# Patient Record
Sex: Female | Born: 1995 | Race: White | Hispanic: No | Marital: Single | Smoking: Never smoker
Health system: Southern US, Community
[De-identification: ages and names within clinical notes are randomized; demographics above are authoritative.]

## PROBLEM LIST (undated history)

## (undated) DIAGNOSIS — G8929 Other chronic pain: Secondary | ICD-10-CM

## (undated) DIAGNOSIS — M546 Pain in thoracic spine: Secondary | ICD-10-CM

## (undated) DIAGNOSIS — F1290 Cannabis use, unspecified, uncomplicated: Secondary | ICD-10-CM

## (undated) DIAGNOSIS — F329 Major depressive disorder, single episode, unspecified: Secondary | ICD-10-CM

## (undated) DIAGNOSIS — F32A Depression, unspecified: Secondary | ICD-10-CM

---
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
## 2014-02-13 HISTORY — PX: HAND SURGERY: SHX662

## 2014-04-28 ENCOUNTER — Emergency Department: Payer: Self-pay | Admitting: Emergency Medicine

## 2014-05-11 ENCOUNTER — Ambulatory Visit: Payer: Self-pay | Admitting: Unknown Physician Specialty

## 2014-06-14 NOTE — Op Note (Signed)
PATIENT NAME:  Rachel Richards, Rachel Richards MR#:  [id number] DATE OF BIRTH:  08-Oct-1995  DATE OF PROCEDURE:  05/11/2014  PREOPERATIVE DIAGNOSIS: Displaced right fifth distal metacarpal fracture.   POSTOPERATIVE DIAGNOSIS: Displaced right fifth distal metacarpal fracture.   OPERATION: Closed reduction and intramedullary pinning of the displaced right fifth metacarpal fracture.   SURGEON: Alda BertholdHarold B. Melvin Marmo Jr., MD   ANESTHESIA: General.   HISTORY: The patient sustained a fracture of her right fifth metacarpal about 2 weeks prior to surgery. She had been scheduled for surgery about a week and a half after her injury, but she had an upper respiratory illness and the surgery had to be postponed until 2 weeks out from her injury. The patient was brought in for closed reduction and intramedullary pinning.   DESCRIPTION OF PROCEDURE: The patient was taken to the operating room where satisfactory general anesthesia was achieved. A tourniquet was applied to her right upper arm and the right upper extremity was prepped and draped in the usual fashion for procedure about the hand.   I went ahead and made a small longitudinal incision along the dorsal ulnar base of her right fifth metacarpal. An awl was used to make a hole in the base and then a [employer] intramedullary pin was inserted into the proximal metacarpal. The fracture was manipulated and reduced. The pin was driven across the distal third metacarpal fracture into the distal fracture fragment. This was done with the aid of image intensification. The image intensification views did reveal that the fracture was reduced and the pin was satisfactorily positioned.   I went ahead and bent the proximal portion of the pin about 90 degrees and then used a proximal fixation sleeve to prevent the pin from migrating proximally. This sleeve was driven into the base of the right fifth metacarpal. The redundant portion of the pin and sleeve were cut off  and a small  rubber cap was placed over it. It was left protruding outside the skin.   The small incision was closed with 5-0 nylon sutures in vertical mattress fashion. Betadine was applied to the wound followed by a sterile dressing and a fiberglass short arm cast. I incorporated an aluminum splint for the right little finger.   The patient was then awakened and transferred to her stretcher bed. She was taken to the recovery room in satisfactory condition. Blood loss was negligible.    ____________________________ Alda BertholdHarold B. Taegen Lennox Jr., MD hbk:AT Richards: 05/11/2014 14:59:25 ET T: 05/12/2014 04:05:04 ET JOB#: [id number]  cc: Alda BertholdHarold B. Thurma Priego Jr., MD, <Dictator> Randon GoldsmithHAROLD B Antoinette Borgwardt, Montez HagemanJR MD ELECTRONICALLY SIGNED 05/25/2014 19:12

## 2014-10-14 DIAGNOSIS — M546 Pain in thoracic spine: Secondary | ICD-10-CM

## 2014-10-14 DIAGNOSIS — G8929 Other chronic pain: Secondary | ICD-10-CM | POA: Insufficient documentation

## 2014-10-14 DIAGNOSIS — F329 Major depressive disorder, single episode, unspecified: Secondary | ICD-10-CM | POA: Insufficient documentation

## 2014-10-14 DIAGNOSIS — F32A Depression, unspecified: Secondary | ICD-10-CM | POA: Insufficient documentation

## 2015-03-26 IMAGING — CR RIGHT HAND - COMPLETE 3+ VIEW
1 series · 3 of 3 positions shown · non-contrast
Comparison: None.

CLINICAL DATA: Punched wall today.

EXAM:
RIGHT HAND - COMPLETE 3+ VIEW

[Series 1: x hand pa right · 0.14mm/px · 3 of 3 slices shown]
[im 1/3]
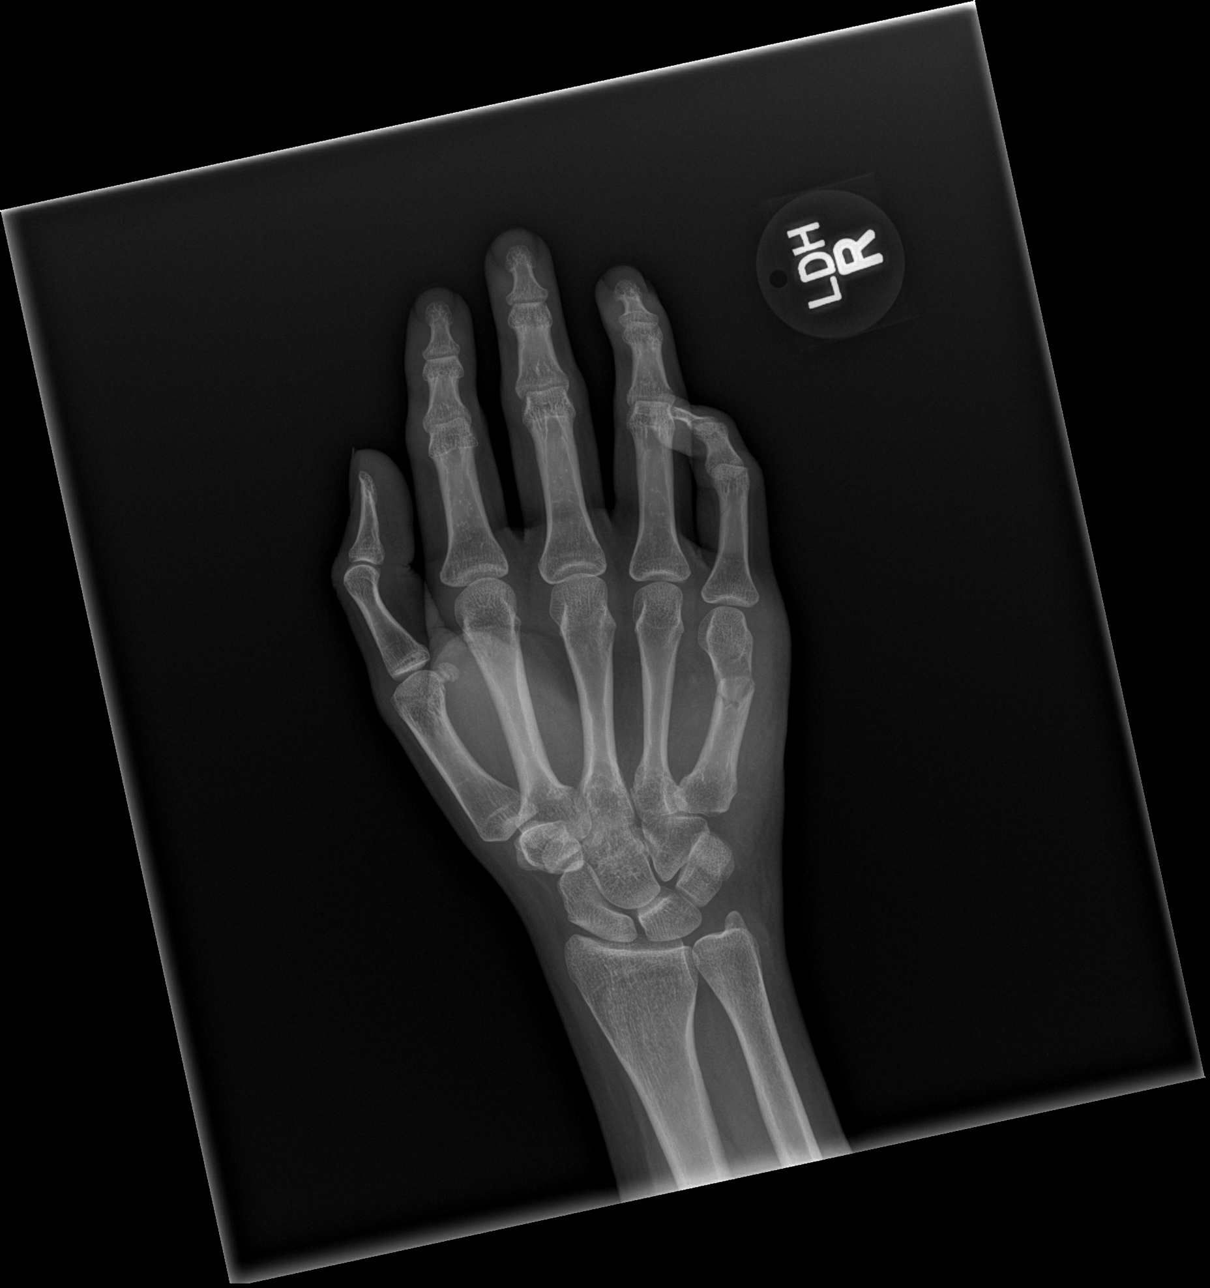
[im 2/3]
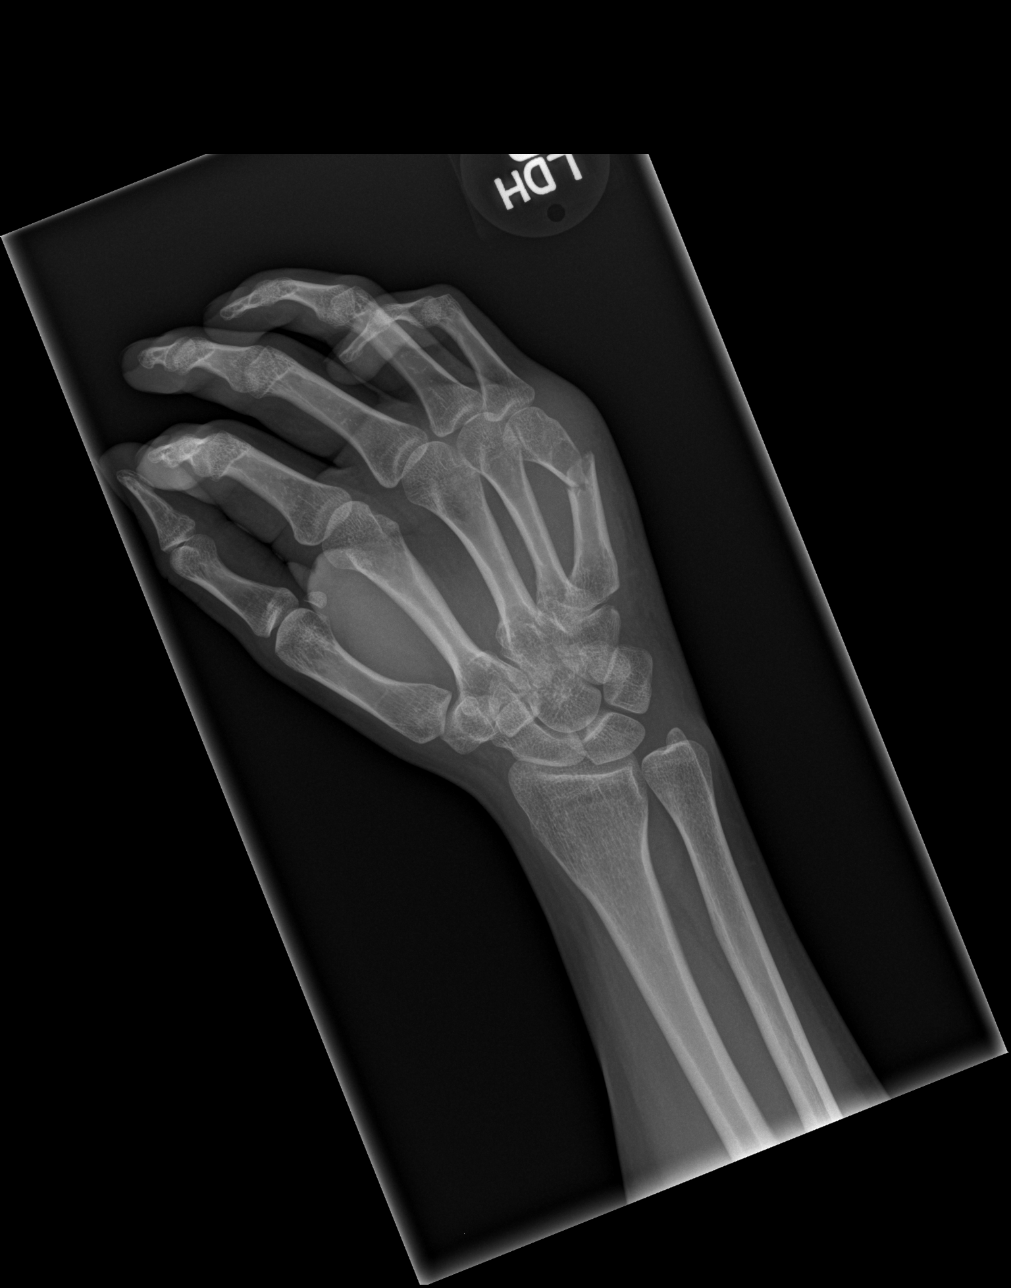
[im 3/3]
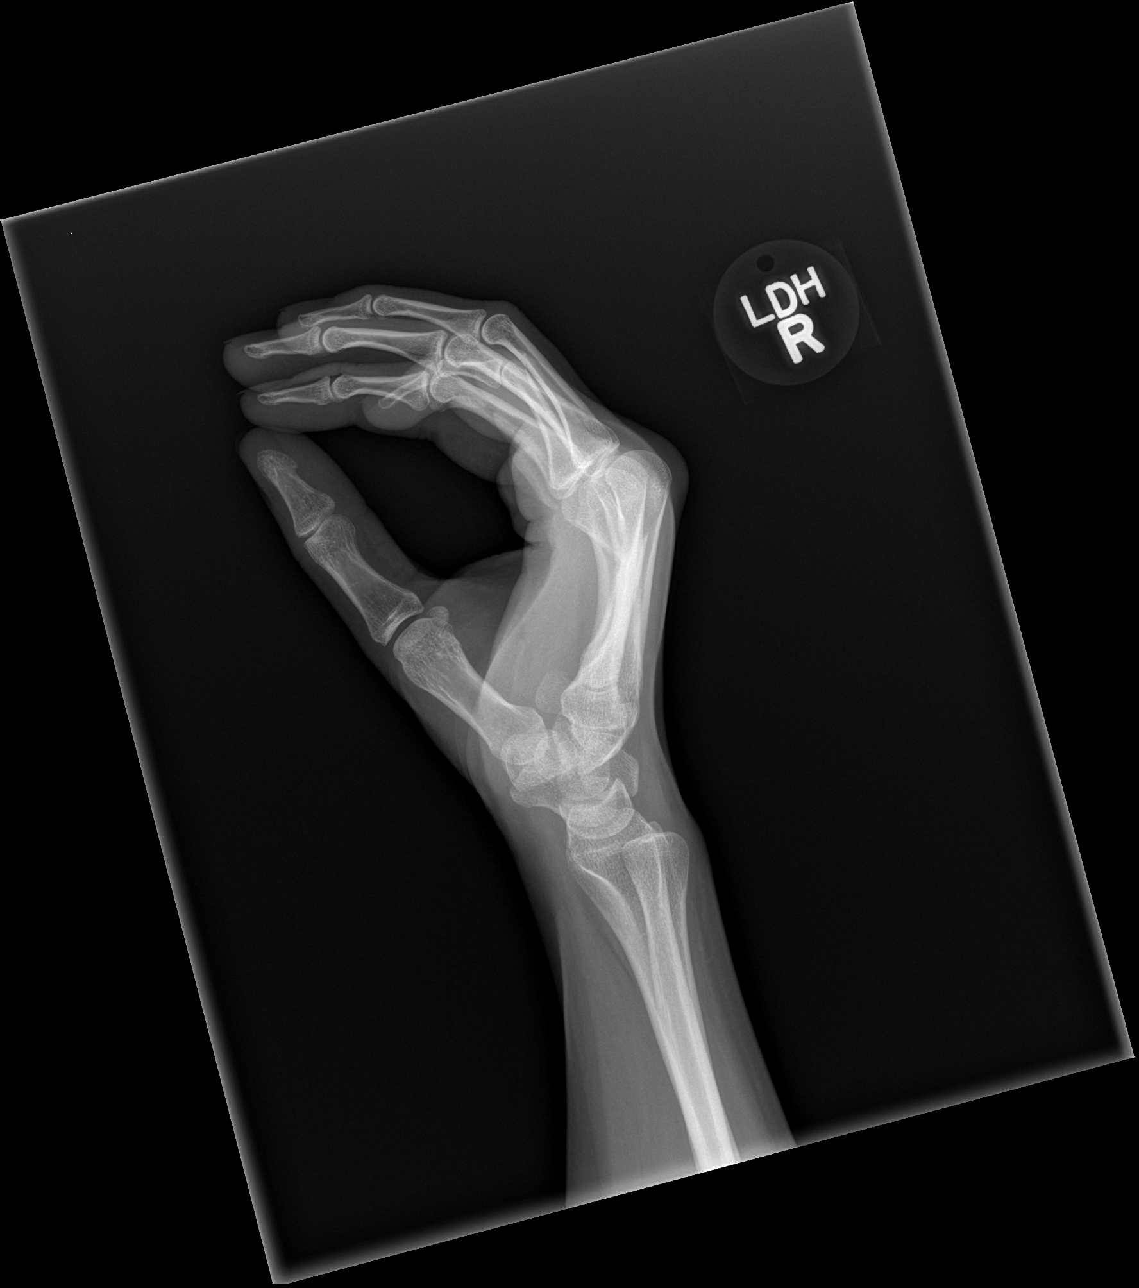

[3 of 3 positions shown; findings below may reference images not displayed]

FINDINGS: Angulated fracture mid shaft of the fifth metacarpal. No other
fracture or arthropathy.
IMPRESSION: Angulated fracture fifth metacarpal.

## 2017-01-11 LAB — OB RESULTS CONSOLE RPR: RPR: NONREACTIVE

## 2017-01-11 LAB — OB RESULTS CONSOLE GC/CHLAMYDIA
CHLAMYDIA, DNA PROBE: NEGATIVE
Gonorrhea: NEGATIVE

## 2017-01-11 LAB — OB RESULTS CONSOLE HEPATITIS B SURFACE ANTIGEN: Hepatitis B Surface Ag: NEGATIVE

## 2017-01-11 LAB — OB RESULTS CONSOLE ABO/RH: RH Type: POSITIVE

## 2017-01-11 LAB — OB RESULTS CONSOLE ANTIBODY SCREEN: ANTIBODY SCREEN: NEGATIVE

## 2017-01-11 LAB — SICKLE CELL SCREEN: Sickle Cell Screen: NEGATIVE

## 2017-01-11 LAB — OB RESULTS CONSOLE PLATELET COUNT: PLATELETS: 293

## 2017-01-11 LAB — OB RESULTS CONSOLE HGB/HCT, BLOOD
HCT: 42
Hemoglobin: 14.3

## 2017-01-12 ENCOUNTER — Other Ambulatory Visit: Payer: Self-pay | Admitting: Physician Assistant

## 2017-01-12 DIAGNOSIS — Z369 Encounter for antenatal screening, unspecified: Secondary | ICD-10-CM

## 2017-01-17 ENCOUNTER — Encounter: Payer: Self-pay | Admitting: Maternal Newborn

## 2017-01-17 ENCOUNTER — Ambulatory Visit (INDEPENDENT_AMBULATORY_CARE_PROVIDER_SITE_OTHER): Payer: Medicaid Other | Admitting: Maternal Newborn

## 2017-01-17 VITALS — BP 110/70 | Ht 62.0 in | Wt 130.0 lb

## 2017-01-17 DIAGNOSIS — Z3A08 8 weeks gestation of pregnancy: Secondary | ICD-10-CM

## 2017-01-17 DIAGNOSIS — Z3400 Encounter for supervision of normal first pregnancy, unspecified trimester: Secondary | ICD-10-CM | POA: Insufficient documentation

## 2017-01-17 DIAGNOSIS — Z3401 Encounter for supervision of normal first pregnancy, first trimester: Secondary | ICD-10-CM

## 2017-01-17 NOTE — Patient Instructions (Signed)
First Trimester of Pregnancy The first trimester of pregnancy is from week 1 until the end of week 13 (months 1 through 3). A week after a sperm fertilizes an egg, the egg will implant on the wall of the uterus. This embryo will begin to develop into a baby. Genes from you and your partner will form the baby. The female genes will determine whether the baby will be a boy or a girl. At 6-8 weeks, the eyes and face will be formed, and the heartbeat can be seen on ultrasound. At the end of 12 weeks, all the baby's organs will be formed. Now that you are pregnant, you will want to do everything you can to have a healthy baby. Two of the most important things are to get good prenatal care and to follow your health care provider's instructions. Prenatal care is all the medical care you receive before the baby's birth. This care will help prevent, find, and treat any problems during the pregnancy and childbirth. Body changes during your first trimester Your body goes through many changes during pregnancy. The changes vary from woman to woman.  You may gain or lose a couple of pounds at first.  You may feel sick to your stomach (nauseous) and you may throw up (vomit). If the vomiting is uncontrollable, call your health care provider.  You may tire easily.  You may develop headaches that can be relieved by medicines. All medicines should be approved by your health care provider.  You may urinate more often. Painful urination may mean you have a bladder infection.  You may develop heartburn as a result of your pregnancy.  You may develop constipation because certain hormones are causing the muscles that push stool through your intestines to slow down.  You may develop hemorrhoids or swollen veins (varicose veins).  Your breasts may begin to grow larger and become tender. Your nipples may stick out more, and the tissue that surrounds them (areola) may become darker.  Your gums may bleed and may be  sensitive to brushing and flossing.  Dark spots or blotches (chloasma, mask of pregnancy) may develop on your face. This will likely fade after the baby is born.  Your menstrual periods will stop.  You may have a loss of appetite.  You may develop cravings for certain kinds of food.  You may have changes in your emotions from day to day, such as being excited to be pregnant or being concerned that something may go wrong with the pregnancy and baby.  You may have more vivid and strange dreams.  You may have changes in your hair. These can include thickening of your hair, rapid growth, and changes in texture. Some women also have hair loss during or after pregnancy, or hair that feels dry or thin. Your hair will most likely return to normal after your baby is born.  What to expect at prenatal visits During a routine prenatal visit:  You will be weighed to make sure you and the baby are growing normally.  Your blood pressure will be taken.  Your abdomen will be measured to track your baby's growth.  The fetal heartbeat will be listened to between weeks 10 and 14 of your pregnancy.  Test results from any previous visits will be discussed.  Your health care provider may ask you:  How you are feeling.  If you are feeling the baby move.  If you have had any abnormal symptoms, such as leaking fluid, bleeding, severe headaches,   or abdominal cramping.  If you are using any tobacco products, including cigarettes, chewing tobacco, and electronic cigarettes.  If you have any questions.  Other tests that may be performed during your first trimester include:  Blood tests to find your blood type and to check for the presence of any previous infections. The tests will also be used to check for low iron levels (anemia) and protein on red blood cells (Rh antibodies). Depending on your risk factors, or if you previously had diabetes during pregnancy, you may have tests to check for high blood  sugar that affects pregnant women (gestational diabetes).  Urine tests to check for infections, diabetes, or protein in the urine.  An ultrasound to confirm the proper growth and development of the baby.  Fetal screens for spinal cord problems (spina bifida) and Down syndrome.  HIV (human immunodeficiency virus) testing. Routine prenatal testing includes screening for HIV, unless you choose not to have this test.  You may need other tests to make sure you and the baby are doing well.  Follow these instructions at home: Medicines  Follow your health care provider's instructions regarding medicine use. Specific medicines may be either safe or unsafe to take during pregnancy.  Take a prenatal vitamin that contains at least 600 micrograms (mcg) of folic acid.  If you develop constipation, try taking a stool softener if your health care provider approves. Eating and drinking  Eat a balanced diet that includes fresh fruits and vegetables, whole grains, good sources of protein such as meat, eggs, or tofu, and low-fat dairy. Your health care provider will help you determine the amount of weight gain that is right for you.  Avoid raw meat and uncooked cheese. These carry germs that can cause birth defects in the baby.  Eating four or five small meals rather than three large meals a day may help relieve nausea and vomiting. If you start to feel nauseous, eating a few soda crackers can be helpful. Drinking liquids between meals, instead of during meals, also seems to help ease nausea and vomiting.  Limit foods that are high in fat and processed sugars, such as fried and sweet foods.  To prevent constipation: ? Eat foods that are high in fiber, such as fresh fruits and vegetables, whole grains, and beans. ? Drink enough fluid to keep your urine clear or pale yellow. Activity  Exercise only as directed by your health care provider. Most women can continue their usual exercise routine during  pregnancy. Try to exercise for 30 minutes at least 5 days a week. Exercising will help you: ? Control your weight. ? Stay in shape. ? Be prepared for labor and delivery.  Experiencing pain or cramping in the lower abdomen or lower back is a good sign that you should stop exercising. Check with your health care provider before continuing with normal exercises.  Try to avoid standing for long periods of time. Move your legs often if you must stand in one place for a long time.  Avoid heavy lifting.  Wear low-heeled shoes and practice good posture.  You may continue to have sex unless your health care provider tells you not to. Relieving pain and discomfort  Wear a good support bra to relieve breast tenderness.  Take warm sitz baths to soothe any pain or discomfort caused by hemorrhoids. Use hemorrhoid cream if your health care provider approves.  Rest with your legs elevated if you have leg cramps or low back pain.  If you develop   varicose veins in your legs, wear support hose. Elevate your feet for 15 minutes, 3-4 times a day. Limit salt in your diet. Prenatal care  Schedule your prenatal visits by the twelfth week of pregnancy. They are usually scheduled monthly at first, then more often in the last 2 months before delivery.  Write down your questions. Take them to your prenatal visits.  Keep all your prenatal visits as told by your health care provider. This is important. Safety  Wear your seat belt at all times when driving.  Make a list of emergency phone numbers, including numbers for family, friends, the hospital, and police and fire departments. General instructions  Ask your health care provider for a referral to a local prenatal education class. Begin classes no later than the beginning of month 6 of your pregnancy.  Ask for help if you have counseling or nutritional needs during pregnancy. Your health care provider can offer advice or refer you to specialists for help  with various needs.  Do not use hot tubs, steam rooms, or saunas.  Do not douche or use tampons or scented sanitary pads.  Do not cross your legs for long periods of time.  Avoid cat litter boxes and soil used by cats. These carry germs that can cause birth defects in the baby and possibly loss of the fetus by miscarriage or stillbirth.  Avoid all smoking, herbs, alcohol, and medicines not prescribed by your health care provider. Chemicals in these products affect the formation and growth of the baby.  Do not use any products that contain nicotine or tobacco, such as cigarettes and e-cigarettes. If you need help quitting, ask your health care provider. You may receive counseling support and other resources to help you quit.  Schedule a dentist appointment. At home, brush your teeth with a soft toothbrush and be gentle when you floss. Contact a health care provider if:  You have dizziness.  You have mild pelvic cramps, pelvic pressure, or nagging pain in the abdominal area.  You have persistent nausea, vomiting, or diarrhea.  You have a bad smelling vaginal discharge.  You have pain when you urinate.  You notice increased swelling in your face, hands, legs, or ankles.  You are exposed to fifth disease or chickenpox.  You are exposed to German measles (rubella) and have never had it. Get help right away if:  You have a fever.  You are leaking fluid from your vagina.  You have spotting or bleeding from your vagina.  You have severe abdominal cramping or pain.  You have rapid weight gain or loss.  You vomit blood or material that looks like coffee grounds.  You develop a severe headache.  You have shortness of breath.  You have any kind of trauma, such as from a fall or a car accident. Summary  The first trimester of pregnancy is from week 1 until the end of week 13 (months 1 through 3).  Your body goes through many changes during pregnancy. The changes vary from  woman to woman.  You will have routine prenatal visits. During those visits, your health care provider will examine you, discuss any test results you may have, and talk with you about how you are feeling. This information is not intended to replace advice given to you by your health care provider. Make sure you discuss any questions you have with your health care provider. Document Released: 01/24/2001 Document Revised: 01/12/2016 Document Reviewed: 01/12/2016 Elsevier Interactive Patient Education  2017 Elsevier   Inc.  

## 2017-01-17 NOTE — Progress Notes (Signed)
01/17/2017   Chief Complaint: Desires prenatal care.  Transfer of Care Patient: Yes, from [facility].  History of Present Illness: [name] is a 21 y.o. G1P0 at 6725w6d based on Patient's last menstrual period on 11/16/2016 (exact date), with an Estimated Date of Delivery: 08/23/17, with the above CC.   Her periods were: regular periods every  28 days, lasting for four days She was using no method when she conceived. She has Positive signs or symptoms of nausea/vomiting of pregnancy. She has Negative signs or symptoms of miscarriage or preterm labor She identifies Negative Zika risk factors for her and her partner On any different medications around the time she conceived/early pregnancy: No  History of varicella: No   ROS: A 12-point review of systems was performed and negative, except as stated in the above HPI.  OBGYN History: As per HPI. OB History  Gravida Para Term Preterm AB Living  1            SAB TAB Ectopic Multiple Live Births               # Outcome Date GA Lbr Len/2nd Weight Sex Delivery Anes PTL Lv  1 Current               Any issues with any prior pregnancies: not applicable Any prior children are healthy, doing well, without any problems or issues: not applicable History of pap smears: Yes. Last pap smear: Patient reports Pap at [facility] on 01/11/2017, does not know result yet. History of STIs: Chlamydia in 2016.   Past Medical History: History reviewed. No pertinent past medical history.  Past Surgical History: Past Surgical History:  Procedure Laterality Date  . HAND SURGERY Right 2016   repair broken hand  . WISDOM TOOTH EXTRACTION  2014   all four    Family History:  Family History  Problem Relation Age of Onset  . Colon cancer Maternal Uncle 35   She denies any female cancers, bleeding or blood clotting disorders.  She denies any history of intellectual disability, birth defects or genetic disorders in her or the FOB's history  Social History:   Social History   Socioeconomic History  . Marital status: Single    Spouse name: Not on file  . Number of children: Not on file  . Years of education: 1814  . Highest education level: Not on file  Social Needs  . Financial resource strain: Not on file  . Food insecurity - worry: Not on file  . Food insecurity - inability: Not on file  . Transportation needs - medical: Not on file  . Transportation needs - non-medical: Not on file  Occupational History  . Not on file  Tobacco Use  . Smoking status: Never Smoker  . Smokeless tobacco: Never Used  Substance and Sexual Activity  . Alcohol use: No    Frequency: Never  . Drug use: No  . Sexual activity: Yes    Birth control/protection: None  Other Topics Concern  . Not on file  Social History Narrative  . Not on file   Any cats in the household: no Denies history of and current domestic violence.     Allergy: No Known Allergies  Current Outpatient Medications:  Current Outpatient Medications:  .  Prenatal Multivit-Min-Fe-FA (PRENATAL VITAMINS) 0.8 MG tablet, Take 1 tablet by mouth daily., Disp: , Rfl:    Physical Exam:   BP 110/70   Ht 5\' 2"  (1.575 m)   Wt 130 lb (59  kg)   LMP 11/16/2016 (Exact Date)   BMI 23.78 kg/m  Body mass index is 23.78 kg/m. Constitutional: Well nourished, well developed female in no acute distress.  Neck:  Supple, normal appearance, and no thyromegaly  Cardiovascular: S1, S2 normal, no murmur, rub or gallop, regular rate and rhythm Respiratory:  Clear to auscultation bilateral. Normal respiratory effort Abdomen: positive bowel sounds and no masses, hernias; diffusely non tender to palpation, non distended Breasts: right breast normal without mass, skin or nipple changes or axillary nodes, left breast normal without mass, skin or nipple changes or axillary nodes, risk and benefit of breast self-exam was discussed. Neuro/Psych:  Normal mood and affect.  Skin:  Warm and dry.  Lymphatic:  No  inguinal lymphadenopathy.   Pelvic exam: is not limited by body habitus External genitalia, Bartholin's glands, Urethra, Skene's glands: within normal limits Vagina: within normal limits and with no blood in the vault  Cervix: normal appearing cervix without discharge or lesions, closed/long/high Uterus:  enlarged: consistent with pregnancy Adnexa:  normal adnexa and no mass, fullness, tenderness  Assessment: [name] is a 21 y.o. G1P0 at 2415w6d based on Patient's last menstrual period on 11/16/2016 (exact date), with an Estimated Date of Delivery: 08/23/17, presenting for prenatal care.  Plan:  1) Avoid alcoholic beverages. 2) Patient encouraged not to smoke.  3) Discontinue the use of all non-medicinal drugs and chemicals.  4) Take prenatal vitamins daily.  5) Seatbelt use advised. 6) Nutrition, food safety (fish, cheese advisories, and high nitrite foods) and exercise discussed. 7) [facility] and [facility] discussed.  8) Patient is asked about travel to areas at risk for the Zika virus, and counseled to avoid travel and exposure to mosquitoes or sexual partners who may have themselves been exposed to the virus. Testing is discussed, and will be ordered as appropriate.  9) [facility] advised. 10) Genetic Screening, such as with 1st Trimester Screening, cell free fetal DNA, AFP testing, and Ultrasound, as well as with amniocentesis and CVS as appropriate, is discussed with patient. She plans to have genetic testing this pregnancy. 11) Patient had NOB labs at [facility], all normal except UDS positive for marijuana. Rubella and varicella titers not done, needed at next lab draw. 12) Dating/viability scan and ROB next week.   Problem list reviewed and updated.  Return in about 1 week (around 01/24/2017) for ROB following ultrasound.  Marcelyn BruinsJacelyn Yulia Ulrich, CNM [facility], [facility] 01/17/2017  2:39 PM

## 2017-01-24 ENCOUNTER — Ambulatory Visit (INDEPENDENT_AMBULATORY_CARE_PROVIDER_SITE_OTHER): Payer: Medicaid Other | Admitting: Obstetrics and Gynecology

## 2017-01-24 ENCOUNTER — Encounter: Payer: Self-pay | Admitting: Obstetrics and Gynecology

## 2017-01-24 ENCOUNTER — Ambulatory Visit (INDEPENDENT_AMBULATORY_CARE_PROVIDER_SITE_OTHER): Payer: Medicaid Other

## 2017-01-24 ENCOUNTER — Ambulatory Visit: Payer: Medicaid Other | Admitting: Obstetrics and Gynecology

## 2017-01-24 VITALS — BP 112/74 | Wt 129.0 lb

## 2017-01-24 DIAGNOSIS — Z362 Encounter for other antenatal screening follow-up: Secondary | ICD-10-CM | POA: Diagnosis not present

## 2017-01-24 DIAGNOSIS — Z3401 Encounter for supervision of normal first pregnancy, first trimester: Secondary | ICD-10-CM

## 2017-01-24 DIAGNOSIS — Z3A09 9 weeks gestation of pregnancy: Secondary | ICD-10-CM

## 2017-01-24 NOTE — Progress Notes (Signed)
  Routine Prenatal Care Visit Subjective  Rachel Richards is a 21 Richards.o. G1P0 at 1828w6d being seen today for ongoing prenatal care.  She is currently monitored for the following issues for this low-risk pregnancy and has Chronic bilateral thoracic back pain; Depression; Acne; and Encounter for supervision of normal first pregnancy in first trimester on their problem list.  ----------------------------------------------------------------------------------- Patient reports no complaints.    . Vag. Bleeding: None.   . Denies leaking of fluid.  U/S confirms EDD ----------------------------------------------------------------------------------- The following portions of the patient's history were reviewed and updated as appropriate: allergies, current medications, past family history, past medical history, past social history, past surgical history and problem list. Problem list updated. Objective  Blood pressure 112/74, weight 129 lb (58.5 kg), last menstrual period 11/16/2016. Pregravid weight 132 lb (59.9 kg) Total Weight Gain  (-1.361 kg) Urinalysis: Urine Protein: Negative Urine Glucose: Negative  Fetal Status:           General:  Alert, oriented and cooperative. Patient is in no acute distress.  Skin: Skin is warm and dry. No rash noted.   Cardiovascular: Normal heart rate noted  Respiratory: Normal respiratory effort, no problems with respiration noted  Abdomen: Soft, gravid, appropriate for gestational age. Pain/Pressure: Absent     Pelvic:  Cervical exam deferred        Extremities: Normal range of motion.     Mental Status: Normal mood and affect. Normal behavior. Normal judgment and thought content.   Assessment   21 Richards.o. G1P0 at 4628w6d by  08/23/2017, by Last Menstrual Period presenting for routine prenatal visit  Plan   FIRST Problems (from 01/17/17 to present)    Problem Noted Resolved   Encounter for supervision of normal first pregnancy in first trimester 01/17/2017 by Oswaldo ConroySchmid,  Rachel Richards, CNM No   Overview Signed 01/17/2017  1:59 PM by Oswaldo ConroySchmid, Rachel Richards, CNM        Please refer to After Visit Summary for other counseling recommendations.   Return in about 2 weeks (around 02/07/2017) for schedule u/s for NT and routine prenatal after.  Rachel MohairStephen Mixtli Reno, MD  01/24/2017 11:03 AM

## 2017-01-25 LAB — VARICELLA ZOSTER ANTIBODY, IGG: Varicella zoster IgG: 779 index (ref >165)

## 2017-01-25 LAB — RUBELLA SCREEN: Rubella Antibodies, IGG: 4.91 index (ref >0.99)

## 2017-02-07 ENCOUNTER — Encounter: Payer: Medicaid Other | Admitting: Advanced Practice Midwife

## 2017-02-07 ENCOUNTER — Other Ambulatory Visit: Payer: Medicaid Other

## 2017-02-08 ENCOUNTER — Ambulatory Visit: Payer: Self-pay

## 2017-02-13 NOTE — L&D Delivery Note (Signed)
Delivery Note Primary OB: [facility] Provider: Marcelyn BruinsJacelyn Javae Braaten, CNM Attending MD: Thomasene MohairStephen Jackson, MD Gestational Age: Full term Antepartum complications: oligohydramnios Intrapartum complications: None  A viable female was delivered via vertex presentation, ROA. A loose nuchal cord was reduced. Delivery of the shoulders and body followed without difficulty. The infant was placed on the maternal abdomen.   The umbilical cord was doubly clamped and cut following delayed cord clamping. Cord blood was collected. The placenta was delivered spontaneously and was inspected and found to be intact with a three vessel cord. The cervix and vagina were inspected. There was a first degree perineal laceration and small abrasions on the interior of the left and right labia minora. All were hemostatic. The fundus was firm. Patient and infant were bonding in stable condition. All counts were correct.  Apgars:8 ,9  Weight:  6 lb 11 oz .   Placenta status: spontaneous and Intact.  Cord: 3 vessels.  Anesthesia:  epidural Episiotomy:  none Lacerations:  1st degree Suture Repair: none Est. Blood Loss (mL):  less than 100 mL  Mom to postpartum.  Baby to Couplet care / Skin to Skin.  Dr. Jean RosenthalJackson was present for the entirety of this delivery.  Marcelyn BruinsJacelyn Natan Hartog, CNM [facility], [facility] 08/18/2017  3:04 PM

## 2017-02-15 ENCOUNTER — Encounter: Payer: Self-pay | Admitting: Advanced Practice Midwife

## 2017-02-15 ENCOUNTER — Ambulatory Visit (INDEPENDENT_AMBULATORY_CARE_PROVIDER_SITE_OTHER): Payer: Medicaid Other | Admitting: Advanced Practice Midwife

## 2017-02-15 VITALS — BP 116/86 | Wt 129.0 lb

## 2017-02-15 DIAGNOSIS — Z3400 Encounter for supervision of normal first pregnancy, unspecified trimester: Secondary | ICD-10-CM

## 2017-02-15 DIAGNOSIS — Z3A13 13 weeks gestation of pregnancy: Secondary | ICD-10-CM

## 2017-02-15 DIAGNOSIS — Z87898 Personal history of other specified conditions: Secondary | ICD-10-CM

## 2017-02-15 DIAGNOSIS — F1991 Other psychoactive substance use, unspecified, in remission: Secondary | ICD-10-CM

## 2017-02-15 NOTE — Patient Instructions (Signed)

## 2017-02-15 NOTE — Progress Notes (Signed)
  Routine Prenatal Care Visit  Subjective  Shaune LeeksMerissa D April HoldingRoeder is a 22 y.o. G1P0 at 2257w0d being seen today for ongoing prenatal care.  She is currently monitored for the following issues for this low-risk pregnancy and has Chronic bilateral thoracic back pain; Depression; Acne; and Supervision of normal first pregnancy, antepartum on their problem list.  ----------------------------------------------------------------------------------- Patient reports no complaints.    . Vag. Bleeding: None.   . Denies leaking of fluid.  ----------------------------------------------------------------------------------- The following portions of the patient's history were reviewed and updated as appropriate: allergies, current medications, past family history, past medical history, past social history, past surgical history and problem list. Problem list updated.   Objective  Blood pressure 116/86, weight 129 lb (58.5 kg), last menstrual period 11/16/2016. Pregravid weight 132 lb (59.9 kg) Total Weight Gain  (-1.361 kg) Urinalysis: Urine Protein: Negative Urine Glucose: Negative  Fetal Status: Fetal Heart Rate (bpm): 150         General:  Alert, oriented and cooperative. Patient is in no acute distress.  Skin: Skin is warm and dry. No rash noted.   Cardiovascular: Normal heart rate noted  Respiratory: Normal respiratory effort, no problems with respiration noted  Abdomen: Soft, gravid, appropriate for gestational age.       Pelvic:  Cervical exam deferred        Extremities: Normal range of motion.     Mental Status: Normal mood and affect. Normal behavior. Normal judgment and thought content.   Assessment   22 y.o. G1P0 at 5457w0d by  08/23/2017, by Last Menstrual Period presenting for routine prenatal visit  Plan   FIRST Problems (from 01/17/17 to present)    Problem Noted Resolved   Supervision of normal first pregnancy, antepartum 01/17/2017 by Oswaldo ConroySchmid, Jacelyn Y, CNM No   Overview Signed 01/17/2017   1:59 PM by Oswaldo ConroySchmid, Jacelyn Y, CNM    [facility]inic [facility] Prenatal Labs  Dating  Blood type:     Genetic Screen 1 Screen:    AFP:     Quad:     NIPS: declined screening Antibody:   Anatomic US  Rubella:   Varicella:    GTT Early:               Third trimester:  RPR:     Rhogam  HBsAg:     TDaP vaccine                       Flu Shot: HIV:     Baby Food                                GBS:   Contraception  Pap:  CBB     CS/VBAC    Support Person                  Preterm labor symptoms and general obstetric precautions including but not limited to vaginal bleeding, contractions, leaking of fluid and fetal movement were reviewed in detail with the patient. Please refer to After Visit Summary for other counseling recommendations.   Return in about 4 weeks (around 03/15/2017) for rob.  Tresea MallJane Marimar Suber, CNM  02/15/2017 4:32 PM

## 2017-02-15 NOTE — Progress Notes (Signed)
ROB

## 2017-02-16 LAB — URINE CULTURE

## 2017-02-21 LAB — URINE DRUG PANEL 7
AMPHETAMINES, URINE: NEGATIVE ng/mL
BENZODIAZEPINE QUANT UR: NEGATIVE ng/mL
Barbiturate Quant, Ur: NEGATIVE ng/mL
CANNABINOID QUANT UR: POSITIVE — AB
Cocaine (Metab.): NEGATIVE ng/mL
Opiate Quant, Ur: NEGATIVE ng/mL
PCP Quant, Ur: NEGATIVE ng/mL

## 2017-03-15 ENCOUNTER — Encounter: Payer: Medicaid Other | Admitting: Advanced Practice Midwife

## 2017-03-15 ENCOUNTER — Encounter: Payer: Medicaid Other | Admitting: Certified Nurse Midwife

## 2017-03-26 ENCOUNTER — Ambulatory Visit (INDEPENDENT_AMBULATORY_CARE_PROVIDER_SITE_OTHER): Payer: Medicaid Other | Admitting: Obstetrics and Gynecology

## 2017-03-26 ENCOUNTER — Encounter: Payer: Medicaid Other | Admitting: Advanced Practice Midwife

## 2017-03-26 ENCOUNTER — Encounter: Payer: Self-pay | Admitting: Obstetrics and Gynecology

## 2017-03-26 VITALS — BP 112/76 | Wt 131.0 lb

## 2017-03-26 DIAGNOSIS — Z3400 Encounter for supervision of normal first pregnancy, unspecified trimester: Secondary | ICD-10-CM

## 2017-03-26 DIAGNOSIS — Z3A18 18 weeks gestation of pregnancy: Secondary | ICD-10-CM

## 2017-03-26 DIAGNOSIS — Z124 Encounter for screening for malignant neoplasm of cervix: Secondary | ICD-10-CM

## 2017-03-26 DIAGNOSIS — R319 Hematuria, unspecified: Secondary | ICD-10-CM | POA: Diagnosis not present

## 2017-03-26 LAB — POCT URINALYSIS DIPSTICK
Bilirubin, UA: NEGATIVE
Blood, UA: NEGATIVE
Glucose, UA: NEGATIVE
Nitrite, UA: NEGATIVE
Spec Grav, UA: 1.02
Urobilinogen, UA: 0.2 U/dL
pH, UA: 6

## 2017-03-26 NOTE — Progress Notes (Signed)
Routine Prenatal Care Visit  Subjective  Rachel Richards is a 22 y.o. G1P0 at [redacted]w[redacted]d being seen today for ongoing prenatal care.  She is currently monitored for the following issues for this low-risk pregnancy and has Chronic bilateral thoracic back pain; Depression; Acne; and Supervision of normal first pregnancy, antepartum on their problem list.  ----------------------------------------------------------------------------------- Patient reports a little blood on Friday morning when urinating. None since. Some right sided intermittent abdominal pain. Pt emptied bladder prior to apt. Contractions: Not present. Vag. Bleeding: Other.   . Denies leaking of fluid.  ----------------------------------------------------------------------------------- The following portions of the patient's history were reviewed and updated as appropriate: allergies, current medications, past family history, past medical history, past social history, past surgical history and problem list. Problem list updated.   Objective  Blood pressure 112/76, weight 131 lb (59.4 kg), last menstrual period 11/16/2016. Pregravid weight 132 lb (59.9 kg) Total Weight Gain  (-0.454 kg) Urinalysis:      Fetal Status: Fetal Heart Rate (bpm): 147         General:  Alert, oriented and cooperative. Patient is in no acute distress.  Skin: Skin is warm and dry. No rash noted.   Cardiovascular: Normal heart rate noted  Respiratory: Normal respiratory effort, no problems with respiration noted  Abdomen: Soft, gravid, appropriate for gestational age. Pain/Pressure: Absent     Pelvic:  Cervical exam performed Dilation: Closed Effacement (%): Thick    Extremities: Normal range of motion.     ental Status: Normal mood and affect. Normal behavior. Normal judgment and thought content.     Assessment   22 y.o. G1P0 at [redacted]w[redacted]d by  08/23/2017, by Last Menstrual Period presenting for routine prenatal visit  Plan   FIRST Problems (from  01/17/17 to present)    Problem Noted Resolved   Supervision of normal first pregnancy, antepartum 01/17/2017 by Oswaldo Conroy, CNM No   Overview Addendum 03/26/2017  2:42 PM by Natale Milch, MD    [facility]inic [facility] Prenatal Labs  Dating  Blood type:   O positive  Genetic Screen 1 Screen:    AFP:     Quad:     NIPS: Antibody:  negative  Anatomic [place]  Rubella:  Immune Varicella:  immune  GTT Early:               Third trimester:  RPR:   Nonreactive  Rhogam  Not applicable HBsAg:   Negative  TDaP vaccine                       Flu Shot: received at health department HIV:     Baby Food  Breast                              GBS:   Contraception  Given info- considering nexplanon Pap: done 03/26/17  CBB   Given Info   CS/VBAC  not relevant   Support Person                  Gestational age appropriate obstetric precautions including but not limited to vaginal bleeding, contractions, leaking of fluid and fetal movement were reviewed in detail with the patient.    Pap and HIV today. Small bleeding with pap smear cyto brush.  Given information on birth control options, considering nexplanon. Given breast feeding book Given information on cord blood banking.  Abstracted labs into results.  Anatomy US at next visit.  Return in about 1 week (around 04/02/2017) for Anatomy US.  Adelene Idlerhristanna Schuman MD [facility], [facility] 03/26/2017, 10:50 PM

## 2017-03-27 LAB — HIV ANTIBODY (ROUTINE TESTING W REFLEX): HIV Screen 4th Generation wRfx: NONREACTIVE

## 2017-03-28 LAB — PAP IG W/ RFLX HPV ASCU: PAP Smear Comment: 0

## 2017-03-29 NOTE — Progress Notes (Signed)
NIL pap, next in 3 years, released to [employer]

## 2017-04-05 ENCOUNTER — Encounter: Payer: Medicaid Other | Admitting: Obstetrics & Gynecology

## 2017-04-05 ENCOUNTER — Other Ambulatory Visit: Payer: Medicaid Other

## 2017-04-10 ENCOUNTER — Ambulatory Visit (INDEPENDENT_AMBULATORY_CARE_PROVIDER_SITE_OTHER): Payer: Medicaid Other | Admitting: Obstetrics & Gynecology

## 2017-04-10 ENCOUNTER — Ambulatory Visit (INDEPENDENT_AMBULATORY_CARE_PROVIDER_SITE_OTHER): Payer: Medicaid Other

## 2017-04-10 VITALS — BP 120/80 | Wt 135.0 lb

## 2017-04-10 DIAGNOSIS — Z3400 Encounter for supervision of normal first pregnancy, unspecified trimester: Secondary | ICD-10-CM

## 2017-04-10 DIAGNOSIS — Z3A20 20 weeks gestation of pregnancy: Secondary | ICD-10-CM

## 2017-04-10 MED ORDER — PROVIDA DHA 16-16-1.25-110 MG PO CAPS: 1.0000 | ORAL_CAPSULE | Freq: Every day | ORAL | 11 refills | Status: DC

## 2017-04-10 NOTE — Patient Instructions (Signed)

## 2017-04-10 NOTE — Progress Notes (Signed)
  Subjective  Fetal Movement? yes Contractions? no Leaking Fluid? no Vaginal Bleeding? no  Objective  BP 120/80   Wt 135 lb (61.2 kg)   LMP 11/16/2016 (Exact Date)   BMI 24.69 kg/m  General: NAD Pumonary: no increased work of breathing Abdomen: gravid, non-tender Extremities: no edema Psychiatric: mood appropriate, affect full  Assessment  22 y.o. G1P0 at 9925w5d by  08/23/2017, by Last Menstrual Period presenting for routine prenatal visit  Plan   Problem List Items Addressed This Visit      Other   Supervision of normal first pregnancy, antepartum    Other Visit Diagnoses    [redacted] weeks gestation of pregnancy    -  Primary   Relevant Orders   US OB Follow Up    Review of ULTRASOUND. I have personally reviewed images and [facility] There is a singleton gestation with subjectively normal amniotic fluid volume. The fetal biometry correlates with established dating. Detailed evaluation of the fetal anatomy was performed.The fetal anatomical survey appears within normal limits within the resolution of ultrasound as described above.  It must be noted that a normal ultrasound is unable to rule out fetal aneuploidy.    Traveling to [place] for 6 weeks; counseled as to travel risks, Zika risks PNV- Provida DHA  Annamarie MajorPaul Eligh Rybacki, MD, Merlinda FrederickFACOG [facility], [facility] 04/10/2017  12:03 PM

## 2017-04-12 ENCOUNTER — Encounter: Payer: Medicaid Other | Admitting: Advanced Practice Midwife

## 2017-04-12 ENCOUNTER — Other Ambulatory Visit: Payer: Medicaid Other

## 2017-05-08 ENCOUNTER — Telehealth: Payer: Self-pay

## 2017-05-08 NOTE — Telephone Encounter (Signed)
Pt is in [place] and has run out of prenatal vitamins and planned to buy OTC PNV there but it was in spanish. Advised pt to consult with pharmacist there if available and make sure package is sealed and new. Pt stated she was at [place] version of [facility] and felt safe taking vitamin.

## 2017-05-08 NOTE — Telephone Encounter (Signed)
Pt calling with questions about prenatal vitamin.   Left msg for pt to call back.   Cb# [phone]

## 2017-05-30 ENCOUNTER — Other Ambulatory Visit: Payer: Medicaid Other

## 2017-05-30 ENCOUNTER — Encounter: Payer: Medicaid Other | Admitting: Obstetrics and Gynecology

## 2017-06-04 ENCOUNTER — Ambulatory Visit (INDEPENDENT_AMBULATORY_CARE_PROVIDER_SITE_OTHER): Payer: Medicaid Other | Admitting: Obstetrics and Gynecology

## 2017-06-04 ENCOUNTER — Encounter: Payer: Self-pay | Admitting: Obstetrics and Gynecology

## 2017-06-04 VITALS — BP 124/74 | Wt 146.0 lb

## 2017-06-04 DIAGNOSIS — O368130 Decreased fetal movements, third trimester, not applicable or unspecified: Secondary | ICD-10-CM

## 2017-06-04 DIAGNOSIS — Z3400 Encounter for supervision of normal first pregnancy, unspecified trimester: Secondary | ICD-10-CM

## 2017-06-04 DIAGNOSIS — Z3A28 28 weeks gestation of pregnancy: Secondary | ICD-10-CM | POA: Diagnosis not present

## 2017-06-04 DIAGNOSIS — Z131 Encounter for screening for diabetes mellitus: Secondary | ICD-10-CM

## 2017-06-04 DIAGNOSIS — Z113 Encounter for screening for infections with a predominantly sexual mode of transmission: Secondary | ICD-10-CM

## 2017-06-04 NOTE — Progress Notes (Incomplete)
  Routine Prenatal Care Visit  Subjective  Rachel LeeksMerissa D April Richards is a 22 y.o. G1P0 at 2952w4d being seen today for ongoing prenatal care.  She is currently monitored for the following issues for this {Blank single:19197::"high-risk","low-risk"} pregnancy and has Chronic bilateral thoracic back pain; Depression; Acne; and Supervision of normal first pregnancy, antepartum on their problem list.  ----------------------------------------------------------------------------------- Patient reports {sx:14538}.   Contractions: Not present. Vag. Bleeding: None.  Movement: (!) Decreased. Denies leaking of fluid.  ----------------------------------------------------------------------------------- The following portions of the patient's history were reviewed and updated as appropriate: allergies, current medications, past family history, past medical history, past social history, past surgical history and problem list. Problem list updated.   Objective  Blood pressure 124/74, weight 146 lb (66.2 kg), last menstrual period 11/16/2016. Pregravid weight 132 lb (59.9 kg) Total Weight Gain 14 lb (6.35 kg) Urinalysis:      Fetal Status: Fetal Heart Rate (bpm): 140   Movement: (!) Decreased     General:  Alert, oriented and cooperative. Patient is in no acute distress.  Skin: Skin is warm and dry. No rash noted.   Cardiovascular: Normal heart rate noted  Respiratory: Normal respiratory effort, no problems with respiration noted  Abdomen: Soft, gravid, appropriate for gestational age. Pain/Pressure: Absent     Pelvic:  {Blank single:19197::"Cervical exam performed","Cervical exam deferred"}        Extremities: Normal range of motion.     Mental Status: Normal mood and affect. Normal behavior. Normal judgment and thought content.   Assessment   22 y.o. G1P0 at 5952w4d by  08/23/2017, by Last Menstrual Period presenting for {Blank single:19197::"routine","work-in"} prenatal visit  Plan   FIRST Problems (from  01/17/17 to present)    Problem Noted Resolved   Supervision of normal first pregnancy, antepartum 01/17/2017 by Oswaldo ConroySchmid, Jacelyn Y, CNM No   Overview Addendum 03/27/2017  2:03 PM by Natale MilchSchuman, Christanna R, MD    [facility] Prenatal Labs  Dating LMP= 9wk US Blood type: O/Positive/-- (11/29 0000) O positive  Genetic Screen Declines Antibody:Negative (11/29 0000) negative  Anatomic US  Rubella: 4.91 (12/12 1059)Immune Varicella:  immune  GTT Early:               Third trimester:  RPR: Nonreactive (11/29 0000) Nonreactive  Rhogam  Not applicable HBsAg: Negative (11/29 0000) Negative  TDaP vaccine                       Flu Shot: received at health department HIV: Non Reactive (02/11 1443)   Baby Food  Breast                              GBS:   Contraception  Given info- considering nexplanon Pap: done 03/26/17  CBB   Given Info   CS/VBAC  not relevant   Support Person                  {Blank single:19197::"Term","Preterm"} labor symptoms and general obstetric precautions including but not limited to vaginal bleeding, contractions, leaking of fluid and fetal movement were reviewed in detail with the patient. Please refer to After Visit Summary for other counseling recommendations.   Return in 2 days (on 06/06/2017) for add 28 week labs to vist 4/24.  Thomasene MohairStephen Jackson, MD, Merlinda FrederickFACOG [facility], [facility] 06/04/2017 5:12 PM  sdf

## 2017-06-04 NOTE — Progress Notes (Signed)
Routine Prenatal Care Visit  Subjective  Rachel Richards is a 22 y.o. G1P0 at [redacted]w[redacted]d being seen today for ongoing prenatal care.  She is currently monitored for the following issues for this low-risk pregnancy and has Chronic bilateral thoracic back pain; Depression; Acne; and Supervision of normal first pregnancy, antepartum on their problem list.  ----------------------------------------------------------------------------------- Patient reports no complaints.   Contractions: Not present. Vag. Bleeding: None.  Movement: (!) Decreased. Denies leaking of fluid.  ----------------------------------------------------------------------------------- The following portions of the patient's history were reviewed and updated as appropriate: allergies, current medications, past family history, past medical history, past social history, past surgical history and problem list. Problem list updated.   Objective  Blood pressure 124/74, weight 146 lb (66.2 kg), last menstrual period 11/16/2016. Pregravid weight 132 lb (59.9 kg) Total Weight Gain 14 lb (6.35 kg) Urinalysis:      Fetal Status: Fetal Heart Rate (bpm): 140   Movement: (!) Decreased     General:  Alert, oriented and cooperative. Patient is in no acute distress.  Skin: Skin is warm and dry. No rash noted.   Cardiovascular: Normal heart rate noted  Respiratory: Normal respiratory effort, no problems with respiration noted  Abdomen: Soft, gravid, appropriate for gestational age. Pain/Pressure: Absent     Pelvic:  Cervical exam deferred        Extremities: Normal range of motion.     Mental Status: Normal mood and affect. Normal behavior. Normal judgment and thought content.   NST: Baseline FHR: 140 beats/min Variability: moderate Accelerations: present (10x10) Decelerations: absent Tocometry: not done  Interpretation:  INDICATIONS: decreased fetal movement RESULTS:  A NST procedure was performed with FHR monitoring and a normal  baseline established, appropriate time of 20-40 minutes of evaluation, and accels >2 seen w 15x15 characteristics.  Results show a REACTIVE NST.    Assessment   22 y.o. G1P0 at [redacted]w[redacted]d by  08/23/2017, by Last Menstrual Period presenting for work-in prenatal visit  Plan   FIRST Problems (from 01/17/17 to present)    Problem Noted Resolved   Supervision of normal first pregnancy, antepartum 01/17/2017 by Oswaldo Conroy, CNM No   Overview Addendum 03/27/2017  2:03 PM by Natale Milch, MD    [facility] Prenatal Labs  Dating LMP= 9wk [place] Blood type: O/Positive/-- (11/29 0000) O positive  Genetic Screen Declines Antibody:Negative (11/29 0000) negative  Anatomic [place]  Rubella: 4.91 (12/12 1059)Immune Varicella:  immune  GTT Early:               Third trimester:  RPR: Nonreactive (11/29 0000) Nonreactive  Rhogam  Not applicable HBsAg: Negative (11/29 0000) Negative  TDaP vaccine                       Flu Shot: received at health department HIV: Non Reactive (02/11 1443)   Baby Food  Breast                              GBS:   Contraception  Given info- considering nexplanon Pap: done 03/26/17  CBB   Given Info   CS/VBAC  not relevant   Support Person                 Preterm labor symptoms and general obstetric precautions including but not limited to vaginal bleeding, contractions, leaking of fluid and fetal movement were reviewed in detail with the patient. Please refer to After  Visit Summary for other counseling recommendations.   Return in 2 days (on 06/06/2017) for add 28 week labs to vist 4/24.  Rachel MohairStephen Cortny Bambach, MD, Merlinda FrederickFACOG [facility], [facility] 06/04/2017 5:12 PM

## 2017-06-06 ENCOUNTER — Encounter: Payer: Self-pay | Admitting: Advanced Practice Midwife

## 2017-06-06 ENCOUNTER — Ambulatory Visit (INDEPENDENT_AMBULATORY_CARE_PROVIDER_SITE_OTHER): Payer: Medicaid Other | Admitting: Advanced Practice Midwife

## 2017-06-06 ENCOUNTER — Other Ambulatory Visit: Payer: Medicaid Other

## 2017-06-06 ENCOUNTER — Ambulatory Visit (INDEPENDENT_AMBULATORY_CARE_PROVIDER_SITE_OTHER): Payer: Medicaid Other

## 2017-06-06 VITALS — BP 122/76 | Wt 142.0 lb

## 2017-06-06 DIAGNOSIS — Z3A20 20 weeks gestation of pregnancy: Secondary | ICD-10-CM

## 2017-06-06 DIAGNOSIS — Z3A28 28 weeks gestation of pregnancy: Secondary | ICD-10-CM

## 2017-06-06 DIAGNOSIS — Z113 Encounter for screening for infections with a predominantly sexual mode of transmission: Secondary | ICD-10-CM

## 2017-06-06 DIAGNOSIS — Z130 Encounter for screening for diseases of the blood and blood-forming organs and certain disorders involving the immune mechanism: Secondary | ICD-10-CM

## 2017-06-06 DIAGNOSIS — Z131 Encounter for screening for diabetes mellitus: Secondary | ICD-10-CM

## 2017-06-06 NOTE — Progress Notes (Signed)
ROB F/U anatomy scan 28 week labs

## 2017-06-06 NOTE — Progress Notes (Signed)
  Routine Prenatal Care Visit  Subjective  Rachel Richards is a 22 y.o. G1P0 at 4993w6d being seen today for ongoing prenatal care.  She is currently monitored for the following issues for this low-risk pregnancy and has Chronic bilateral thoracic back pain; Depression; Acne; and Supervision of normal first pregnancy, antepartum on their problem list.  ----------------------------------------------------------------------------------- Patient reports no complaints.   Contractions: Not present. Vag. Bleeding: None.  Movement: Present. Denies leaking of fluid.  ----------------------------------------------------------------------------------- The following portions of the patient's history were reviewed and updated as appropriate: allergies, current medications, past family history, past medical history, past social history, past surgical history and problem list. Problem list updated.   Objective  Blood pressure 122/76, weight 142 lb (64.4 kg), last menstrual period 11/16/2016. Pregravid weight 132 lb (59.9 kg) Total Weight Gain 10 lb (4.536 kg) Urinalysis:      Fetal Status: Fetal Heart Rate (bpm): 145 Fundal Height: 29 cm Movement: Present  Presentation: Vertex  Growth scan and follow up anatomy today: complete, 41%, 2 pounds 10 ounces, AFI 15.18  General:  Alert, oriented and cooperative. Patient is in no acute distress.  Skin: Skin is warm and dry. No rash noted.   Cardiovascular: Normal heart rate noted  Respiratory: Normal respiratory effort, no problems with respiration noted  Abdomen: Soft, gravid, appropriate for gestational age. Pain/Pressure: Absent     Pelvic:  Cervical exam deferred        Extremities: Normal range of motion.     Mental Status: Normal mood and affect. Normal behavior. Normal judgment and thought content.   Assessment   22 y.o. G1P0 at 2593w6d by  08/23/2017, by Last Menstrual Period presenting for routine prenatal visit  Plan   FIRST Problems (from  01/17/17 to present)    Problem Noted Resolved   Supervision of normal first pregnancy, antepartum 01/17/2017 by Oswaldo ConroySchmid, Jacelyn Y, CNM No   Overview Addendum 03/27/2017  2:03 PM by Natale MilchSchuman, Christanna R, MD    [facility]inic [facility] Prenatal Labs  Dating LMP= 9wk US Blood type: O/Positive/-- (11/29 0000) O positive  Genetic Screen Declines Antibody:Negative (11/29 0000) negative  Anatomic US  Rubella: 4.91 (12/12 1059)Immune Varicella:  immune  GTT Early:               Third trimester:  RPR: Nonreactive (11/29 0000) Nonreactive  Rhogam  Not applicable HBsAg: Negative (11/29 0000) Negative  TDaP vaccine                       Flu Shot: received at health department HIV: Non Reactive (02/11 1443)   Baby Food  Breast                              GBS:   Contraception  Given info- considering nexplanon Pap: done 03/26/17  CBB   Given Info   CS/VBAC  not relevant   Support Person                  Preterm labor symptoms and general obstetric precautions including but not limited to vaginal bleeding, contractions, leaking of fluid and fetal movement were reviewed in detail with the patient. Please refer to After Visit Summary for other counseling recommendations.   Return in about 2 weeks (around 06/20/2017) for rob.  Tresea MallJane Letricia Krinsky, CNM 06/06/2017 10:48 AM

## 2017-06-06 NOTE — Patient Instructions (Signed)

## 2017-06-07 LAB — 28 WEEK RH+PANEL
Basophils Absolute: 0 10*3/uL (ref 0.0–0.2)
Basos: 0 %
EOS (ABSOLUTE): 0.4 10*3/uL (ref 0.0–0.4)
EOS: 4 %
GESTATIONAL DIABETES SCREEN: 125 mg/dL (ref 65–139)
HEMATOCRIT: 37.3 % (ref 34.0–46.6)
HIV Screen 4th Generation wRfx: NONREACTIVE
Hemoglobin: 12.3 g/dL (ref 11.1–15.9)
Immature Grans (Abs): 0.4 10*3/uL — ABNORMAL HIGH (ref 0.0–0.1)
Immature Granulocytes: 4 %
LYMPHS ABS: 1.4 10*3/uL (ref 0.7–3.1)
Lymphs: 13 %
MCH: 31.8 pg (ref 26.6–33.0)
MCHC: 33 g/dL (ref 31.5–35.7)
MCV: 96 fL (ref 79–97)
MONOS ABS: 0.6 10*3/uL (ref 0.1–0.9)
Monocytes: 5 %
Neutrophils Absolute: 7.8 10*3/uL — ABNORMAL HIGH (ref 1.4–7.0)
Neutrophils: 74 %
PLATELETS: 246 10*3/uL (ref 150–379)
RBC: 3.87 x10E6/uL (ref 3.77–5.28)
RDW: 13.5 % (ref 12.3–15.4)
RPR: NONREACTIVE
WBC: 10.6 10*3/uL (ref 3.4–10.8)

## 2017-06-13 ENCOUNTER — Encounter: Payer: Self-pay | Admitting: Emergency Medicine

## 2017-06-13 ENCOUNTER — Encounter: Payer: Self-pay | Admitting: Obstetrics and Gynecology

## 2017-06-13 ENCOUNTER — Emergency Department
Admission: EM | Admit: 2017-06-13 | Discharge: 2017-06-13 | Disposition: A | Discharge status: Home / Self Care | Payer: Medicaid Other | Attending: Emergency Medicine | Admitting: Emergency Medicine

## 2017-06-13 DIAGNOSIS — Z79899 Other long term (current) drug therapy: Secondary | ICD-10-CM | POA: Insufficient documentation

## 2017-06-13 DIAGNOSIS — J029 Acute pharyngitis, unspecified: Secondary | ICD-10-CM

## 2017-06-13 DIAGNOSIS — O99513 Diseases of the respiratory system complicating pregnancy, third trimester: Secondary | ICD-10-CM | POA: Insufficient documentation

## 2017-06-13 DIAGNOSIS — Z3A30 30 weeks gestation of pregnancy: Secondary | ICD-10-CM | POA: Diagnosis not present

## 2017-06-13 LAB — GROUP A STREP BY PCR: Group A Strep by PCR: NOT DETECTED

## 2017-06-13 NOTE — ED Triage Notes (Signed)
Patient presents to the ED with sore throat x 2 days.  Patient states, "I have blisters on the back of my throat and it hurts to swallow, I think I have strep throat."  Patient is [redacted] weeks pregnant.  Patient's OB-GYN suggested she come to the ED.

## 2017-06-13 NOTE — ED Provider Notes (Signed)
[facility] EMERGENCY DEPARTMENT Provider Note   CSN: [id number] Arrival date & time: 06/13/17  1813     History   Chief Complaint Chief Complaint  Patient presents with  . Sore Throat    HPI Rachel Richards is a 22 y.o. female.  Presents to the emergency department for evaluation of sore throat x2 days.  She is had a lot of posterior pharyngeal drainage and allergies.  Patient has had some mild sneezing.  She denies any fevers, headache, body aches.  She is [redacted] weeks pregnant.  She said no vaginal bleeding, discharge and normal fetal movement.  Patient states she cut her throat earlier noticed there is some redness and ulcerations.  She denies any difficulty swallowing but does have some mild pain with swallowing. HPI  History reviewed. No pertinent past medical history.  Patient Active Problem List   Diagnosis Date Noted  . Supervision of normal first pregnancy, antepartum 01/17/2017  . Chronic bilateral thoracic back pain 10/14/2014  . Depression 10/14/2014  . Acne 04/29/2013    Past Surgical History:  Procedure Laterality Date  . HAND SURGERY Right 2016   repair broken hand  . WISDOM TOOTH EXTRACTION  2014   all four     OB History    Gravida  1   Para      Term      Preterm      AB      Living        SAB      TAB      Ectopic      Multiple      Live Births               Home Medications    Prior to Admission medications   Medication Sig Start Date End Date Taking? Authorizing Provider  Prenat-FeFum-FePo-FA-DHA w/o A (PROVIDA DHA) 16-16-1.25-110 MG CAPS Take 1 capsule by mouth daily. 04/10/17   Nadara Mustard, MD    Family History Family History  Problem Relation Age of Onset  . Colon cancer Maternal Uncle 43    Social History Social History   Tobacco Use  . Smoking status: Never Smoker  . Smokeless tobacco: Never Used  Substance Use Topics  . Alcohol use: No    Frequency: Never  . Drug use: No      Allergies   Patient has no known allergies.   Review of Systems Review of Systems  Constitutional: Negative for fever.  HENT: Positive for congestion and sore throat. Negative for trouble swallowing.   Respiratory: Negative for shortness of breath.   Cardiovascular: Negative for chest pain.  Gastrointestinal: Negative for abdominal pain.  Genitourinary: Negative for difficulty urinating, dysuria, urgency, vaginal bleeding and vaginal discharge.  Musculoskeletal: Negative for back pain and myalgias.  Skin: Negative for rash.  Neurological: Negative for dizziness and headaches.     Physical Exam Updated Vital Signs BP 133/71 (BP Location: Left Arm)   Pulse 97   Temp 98.9 F (37.2 C) (Oral)   Ht  (1.575 m)   Wt 64.4 kg (142 lb)   LMP 11/16/2016 (Exact Date)   SpO2 100%   BMI 25.97 kg/m   Physical Exam  Constitutional: She is oriented to person, place, and time. She appears well-developed and well-nourished. No distress.  HENT:  Head: Normocephalic and atraumatic.  Right Ear: Hearing, external ear and ear canal normal.  Left Ear: Hearing, external ear and ear canal normal.  Nose:  Rhinorrhea present.  Mouth/Throat: Uvula is midline, oropharynx is clear and moist and mucous membranes are normal. No oral lesions. No trismus in the jaw. No uvula swelling. No oropharyngeal exudate, posterior oropharyngeal edema, posterior oropharyngeal erythema or tonsillar abscesses. No tonsillar exudate.  Eyes: Conjunctivae are normal. Right eye exhibits no discharge. Left eye exhibits no discharge.  Neck: Normal range of motion.  Cardiovascular: Normal rate and regular rhythm.  Pulmonary/Chest: Effort normal and breath sounds normal. No stridor. No respiratory distress. She has no wheezes. She has no rales.  Abdominal: Soft. She exhibits no distension. There is no tenderness.  Musculoskeletal: Normal range of motion. She exhibits no deformity.  Lymphadenopathy:    She has no  cervical adenopathy.  Neurological: She is alert and oriented to person, place, and time. She has normal reflexes.  Skin: Skin is warm and dry.  Psychiatric: She has a normal mood and affect. Her behavior is normal. Thought content normal.     ED Treatments / Results  Labs (all labs ordered are listed, but only abnormal results are displayed) Labs Reviewed  GROUP A STREP BY PCR    EKG None  Radiology No results found.  Procedures Procedures (including critical care time)  Medications Ordered in ED Medications - No data to display   Initial Impression / Assessment and Plan / ED Course  I have reviewed the triage vital signs and the nursing notes.  Pertinent labs & imaging results that were available during my care of the patient were reviewed by me and considered in my medical decision making (see chart for details).     22 year old female who is [redacted] weeks pregnant presents with sore throat.  PCR ordered and reviewed by me is negative for streptococcal infection.  Patient without exudates, no cervical lymphadenopathy.  She is without fevers headaches or body aches.  Sore throat likely viral or secondary to drainage from allergies.  Patient will take Tylenol as needed for pain and perform warm salt water gargles.  Patient without vaginal bleeding or discharge, normal fetal movements.  Final Clinical Impressions(s) / ED Diagnoses   Final diagnoses:  Sore throat    ED Discharge Orders    None       Ronnette Juniper 06/13/17 1941    Myrna Blazer, MD 06/13/17 2250

## 2017-06-13 NOTE — ED Notes (Signed)
Throat sore painful and red x 2 days. OBGyn suggested ED visit. Able to drink fluids.

## 2017-06-13 NOTE — Discharge Instructions (Signed)
Please take Tylenol as needed for sore throat pain.  He may use warm salt water gargles.  If any fevers, difficulty swallowing return to the emergency department.

## 2017-06-13 NOTE — ED Notes (Signed)
Fetal heart tones 142 on right side upper side of abd.

## 2017-06-19 ENCOUNTER — Encounter: Payer: Self-pay | Admitting: Obstetrics and Gynecology

## 2017-06-20 ENCOUNTER — Ambulatory Visit (INDEPENDENT_AMBULATORY_CARE_PROVIDER_SITE_OTHER): Payer: Medicaid Other | Admitting: Advanced Practice Midwife

## 2017-06-20 ENCOUNTER — Encounter: Payer: Self-pay | Admitting: Advanced Practice Midwife

## 2017-06-20 VITALS — BP 120/74 | Wt 145.0 lb

## 2017-06-20 DIAGNOSIS — Z3400 Encounter for supervision of normal first pregnancy, unspecified trimester: Secondary | ICD-10-CM

## 2017-06-20 DIAGNOSIS — Z23 Encounter for immunization: Secondary | ICD-10-CM

## 2017-06-20 DIAGNOSIS — R0981 Nasal congestion: Secondary | ICD-10-CM

## 2017-06-20 DIAGNOSIS — Z3A30 30 weeks gestation of pregnancy: Secondary | ICD-10-CM

## 2017-06-20 MED ORDER — PSEUDOEPHEDRINE HCL 30 MG PO TABS: 30.0000 mg | ORAL_TABLET | ORAL | 0 refills | Status: DC | PRN

## 2017-06-20 MED ORDER — PRENATE PIXIE 10-0.6-0.4-200 MG PO CAPS: 1.0000 | ORAL_CAPSULE | Freq: Every day | ORAL | 6 refills | Status: DC

## 2017-06-20 NOTE — Progress Notes (Signed)
Routine Prenatal Care Visit  Subjective  Rachel Richards is a 22 y.o. G1P0 at [redacted]w[redacted]d being seen today for ongoing prenatal care.  She is currently monitored for the following issues for this low-risk pregnancy and has Chronic bilateral thoracic back pain; Depression; Acne; and Supervision of normal first pregnancy, antepartum on their problem list.  ----------------------------------------------------------------------------------- Patient reports nasal congestion. She has a skin tag on her groin area. She is ok waiting until 36 week visit to evaluate. She also has a small bump on her labia that she will try hot compress on. She does not request evaluation today. She denies a history of herpes.   Contractions: Not present. Vag. Bleeding: None.  Movement: Present. Denies leaking of fluid.  ----------------------------------------------------------------------------------- The following portions of the patient's history were reviewed and updated as appropriate: allergies, current medications, past family history, past medical history, past social history, past surgical history and problem list. Problem list updated.   Objective  Blood pressure 120/74, weight 145 lb (65.8 kg), last menstrual period 11/16/2016. Pregravid weight 132 lb (59.9 kg) Total Weight Gain 13 lb (5.897 kg) Urinalysis: Urine Protein: Negative Urine Glucose: Negative  Fetal Status: Fetal Heart Rate (bpm): 142 Fundal Height: 30 cm Movement: Present     General:  Alert, oriented and cooperative. Patient is in no acute distress.  Skin: Skin is warm and dry. No rash noted.   Cardiovascular: Normal heart rate noted  Respiratory: Normal respiratory effort, no problems with respiration noted  Abdomen: Soft, gravid, appropriate for gestational age. Pain/Pressure: Absent     Pelvic:  Cervical exam deferred        Extremities: Normal range of motion.  Edema: None  Mental Status: Normal mood and affect. Normal behavior. Normal  judgment and thought content.   Assessment   22 y.o. G1P0 at [redacted]w[redacted]d by  08/23/2017, by Last Menstrual Period presenting for routine prenatal visit  Plan   FIRST Problems (from 01/17/17 to present)    Problem Noted Resolved   Supervision of normal first pregnancy, antepartum 01/17/2017 by Oswaldo Conroy, CNM No   Overview Addendum 03/27/2017  2:03 PM by Natale Milch, MD    Clinic [facility] Prenatal Labs  Dating LMP= 9wk [place] Blood type: O/Positive/-- (11/29 0000) O positive  Genetic Screen Declines Antibody:Negative (11/29 0000) negative  Anatomic [place]  Rubella: 4.91 (12/12 1059)Immune Varicella:  immune  GTT Early:               Third trimester:  RPR: Nonreactive (11/29 0000) Nonreactive  Rhogam  Not applicable HBsAg: Negative (11/29 0000) Negative  TDaP vaccine                       Flu Shot: [facility] HIV: Non Reactive (02/11 1443)   Baby Food  Breast                              GBS:   Contraception  Given info- considering nexplanon Pap: done 03/26/17  CBB   Given Info   CS/VBAC  not relevant   Support Person                  Preterm labor symptoms and general obstetric precautions including but not limited to vaginal bleeding, contractions, leaking of fluid and fetal movement were reviewed in detail with the patient. Rx sudafed for nasal congestion. Also try saline nasal spray.    Return  in about 2 weeks (around 07/04/2017) for rob.  Tresea Mall, CNM 06/20/2017 4:36 PM

## 2017-06-20 NOTE — Progress Notes (Signed)
No vb. No lof. TDAP today.  °

## 2017-07-04 ENCOUNTER — Encounter: Payer: Self-pay | Admitting: Obstetrics and Gynecology

## 2017-07-04 ENCOUNTER — Ambulatory Visit (INDEPENDENT_AMBULATORY_CARE_PROVIDER_SITE_OTHER): Payer: Medicaid Other | Admitting: Obstetrics and Gynecology

## 2017-07-04 VITALS — BP 112/66 | Wt 148.0 lb

## 2017-07-04 DIAGNOSIS — Z3400 Encounter for supervision of normal first pregnancy, unspecified trimester: Secondary | ICD-10-CM

## 2017-07-04 DIAGNOSIS — Z3A32 32 weeks gestation of pregnancy: Secondary | ICD-10-CM

## 2017-07-04 NOTE — Progress Notes (Signed)
Routine Prenatal Care Visit  Subjective  Rachel Richards is a 22 y.o. G1P0 at [redacted]w[redacted]d being seen today for ongoing prenatal care.  She is currently monitored for the following issues for this low-risk pregnancy and has Chronic bilateral thoracic back pain; Depression; Acne; and Supervision of normal first pregnancy, antepartum on their problem list.  ----------------------------------------------------------------------------------- Patient reports no complaints.   Contractions: Not present. Vag. Bleeding: None.  Movement: Present. Denies leaking of fluid.  ----------------------------------------------------------------------------------- The following portions of the patient's history were reviewed and updated as appropriate: allergies, current medications, past family history, past medical history, past social history, past surgical history and problem list. Problem list updated.   Objective  Blood pressure 112/66, weight 148 lb (67.1 kg), last menstrual period 11/16/2016. Pregravid weight 132 lb (59.9 kg) Total Weight Gain 16 lb (7.258 kg) Urinalysis: Urine Protein: Negative Urine Glucose: Negative  Fetal Status: Fetal Heart Rate (bpm): 150 Fundal Height: 31 cm Movement: Present     General:  Alert, oriented and cooperative. Patient is in no acute distress.  Skin: Skin is warm and dry. No rash noted.   Cardiovascular: Normal heart rate noted  Respiratory: Normal respiratory effort, no problems with respiration noted  Abdomen: Soft, gravid, appropriate for gestational age. Pain/Pressure: Absent     Pelvic:  Cervical exam deferred        Extremities: Normal range of motion.     Mental Status: Normal mood and affect. Normal behavior. Normal judgment and thought content.   Assessment   22 y.o. G1P0 at 106w6d by  08/23/2017, by Last Menstrual Period presenting for routine prenatal visit  Plan   FIRST Problems (from 01/17/17 to present)    Problem Noted Resolved   Supervision of  normal first pregnancy, antepartum 01/17/2017 by Oswaldo Conroy, CNM No   Overview Addendum 03/27/2017  2:03 PM by Natale Milch, MD    [facility]inic [facility] Prenatal Labs  Dating LMP= 9wk [place] Blood type: O/Positive/-- (11/29 0000) O positive  Genetic Screen Declines Antibody:Negative (11/29 0000) negative  Anatomic [place]  Rubella: 4.91 (12/12 1059)Immune Varicella:  immune  GTT Early:               Third trimester:  RPR: Nonreactive (11/29 0000) Nonreactive  Rhogam  Not applicable HBsAg: Negative (11/29 0000) Negative  TDaP vaccine                       Flu Shot: received at health department HIV: Non Reactive (02/11 1443)   Baby Food  Breast                              GBS:   Contraception  Given info- considering nexplanon Pap: done 03/26/17  CBB   Given Info   CS/VBAC  not relevant   Support Person              Depression 10/14/2014 by Oswaldo Conroy, CNM No   Overview Signed 01/17/2017  1:58 PM by Oswaldo Conroy, CNM    Overview:  Failed prozac. Wellbutrin started 8/16          Preterm labor symptoms and general obstetric precautions including but not limited to vaginal bleeding, contractions, leaking of fluid and fetal movement were reviewed in detail with the patient. Please refer to After Visit Summary for other counseling recommendations.   Return in about 2 weeks (around 07/18/2017) for Routine Prenatal Appointment.  Jeannett Senior  Jean Rosenthal, MD, [facility], [facility] 07/04/2017 2:37 PM

## 2017-07-18 ENCOUNTER — Encounter: Payer: Self-pay | Admitting: Obstetrics and Gynecology

## 2017-07-18 ENCOUNTER — Ambulatory Visit (INDEPENDENT_AMBULATORY_CARE_PROVIDER_SITE_OTHER): Payer: Medicaid Other | Admitting: Obstetrics and Gynecology

## 2017-07-18 VITALS — BP 100/62 | Wt 150.0 lb

## 2017-07-18 DIAGNOSIS — Z3400 Encounter for supervision of normal first pregnancy, unspecified trimester: Secondary | ICD-10-CM

## 2017-07-18 DIAGNOSIS — Z3403 Encounter for supervision of normal first pregnancy, third trimester: Secondary | ICD-10-CM

## 2017-07-18 DIAGNOSIS — Z3A34 34 weeks gestation of pregnancy: Secondary | ICD-10-CM

## 2017-07-18 NOTE — Progress Notes (Signed)
Routine Prenatal Care Visit  Subjective  Rachel Richards is a 22 y.o. G1P0 at [redacted]w[redacted]d being seen today for ongoing prenatal care.  She is currently monitored for the following issues for this low-risk pregnancy and has Chronic bilateral thoracic back pain; Depression; Acne; and Supervision of normal first pregnancy, antepartum on their problem list.  ----------------------------------------------------------------------------------- Patient reports no complaints.   Contractions: Not present. Vag. Bleeding: None.  Movement: Present. Denies leaking of fluid.  ----------------------------------------------------------------------------------- The following portions of the patient's history were reviewed and updated as appropriate: allergies, current medications, past family history, past medical history, past social history, past surgical history and problem list. Problem list updated.   Objective  Blood pressure 100/62, weight 150 lb (68 kg), last menstrual period 11/16/2016. Pregravid weight 132 lb (59.9 kg) Total Weight Gain 18 lb (8.165 kg) Urinalysis:      Fetal Status: Fetal Heart Rate (bpm): 130 Fundal Height: 34 cm Movement: Present     General:  Alert, oriented and cooperative. Patient is in no acute distress.  Skin: Skin is warm and dry. No rash noted.   Cardiovascular: Normal heart rate noted  Respiratory: Normal respiratory effort, no problems with respiration noted  Abdomen: Soft, gravid, appropriate for gestational age. Pain/Pressure: Absent     Pelvic:  Cervical exam deferred        Extremities: Normal range of motion.  Edema: None  Mental Status: Normal mood and affect. Normal behavior. Normal judgment and thought content.   Assessment   22 y.o. G1P0 at [redacted]w[redacted]d by  08/23/2017, by Last Menstrual Period presenting for routine prenatal visit  Plan   FIRST Problems (from 01/17/17 to present)    Problem Noted Resolved   Supervision of normal first pregnancy, antepartum  01/17/2017 by Oswaldo Conroy, CNM No   Overview Addendum 03/27/2017  2:03 PM by Natale Milch, MD    [facility]inic [facility] Prenatal Labs  Dating LMP= 9wk [place] Blood type: O/Positive/-- (11/29 0000) O positive  Genetic Screen Declines Antibody:Negative (11/29 0000) negative  Anatomic [place]  Rubella: 4.91 (12/12 1059)Immune Varicella:  immune  GTT Early:               Third trimester:  RPR: Nonreactive (11/29 0000) Nonreactive  Rhogam  Not applicable HBsAg: Negative (11/29 0000) Negative  TDaP vaccine                       Flu Shot: received at health department HIV: Non Reactive (02/11 1443)   Baby Food  Breast                              GBS:   Contraception  Given info- considering nexplanon Pap: done 03/26/17  CBB   Given Info   CS/VBAC  not relevant   Support Person              Depression 10/14/2014 by Oswaldo Conroy, CNM No   Overview Signed 01/17/2017  1:58 PM by Oswaldo Conroy, CNM    Overview:  Failed prozac. Wellbutrin started 8/16          Preterm labor symptoms and general obstetric precautions including but not limited to vaginal bleeding, contractions, leaking of fluid and fetal movement were reviewed in detail with the patient. Please refer to After Visit Summary for other counseling recommendations.   Return in about 2 weeks (around 08/01/2017) for Routine Prenatal Appointment.  Thomasene Mohair, MD,  [facility], [facility] 07/18/2017 2:29 PM

## 2017-08-01 ENCOUNTER — Encounter: Payer: Medicaid Other | Admitting: Advanced Practice Midwife

## 2017-08-03 ENCOUNTER — Other Ambulatory Visit: Payer: Self-pay | Admitting: Certified Nurse Midwife

## 2017-08-03 ENCOUNTER — Ambulatory Visit (INDEPENDENT_AMBULATORY_CARE_PROVIDER_SITE_OTHER): Payer: Medicaid Other | Admitting: Certified Nurse Midwife

## 2017-08-03 VITALS — BP 120/60 | Wt 153.0 lb

## 2017-08-03 DIAGNOSIS — N760 Acute vaginitis: Secondary | ICD-10-CM

## 2017-08-03 DIAGNOSIS — F1991 Other psychoactive substance use, unspecified, in remission: Secondary | ICD-10-CM

## 2017-08-03 DIAGNOSIS — N898 Other specified noninflammatory disorders of vagina: Secondary | ICD-10-CM

## 2017-08-03 DIAGNOSIS — B9689 Other specified bacterial agents as the cause of diseases classified elsewhere: Secondary | ICD-10-CM

## 2017-08-03 DIAGNOSIS — Z3400 Encounter for supervision of normal first pregnancy, unspecified trimester: Secondary | ICD-10-CM

## 2017-08-03 DIAGNOSIS — O26893 Other specified pregnancy related conditions, third trimester: Secondary | ICD-10-CM

## 2017-08-03 DIAGNOSIS — Z113 Encounter for screening for infections with a predominantly sexual mode of transmission: Secondary | ICD-10-CM

## 2017-08-03 DIAGNOSIS — Z87898 Personal history of other specified conditions: Secondary | ICD-10-CM

## 2017-08-03 DIAGNOSIS — Z3685 Encounter for antenatal screening for Streptococcus B: Secondary | ICD-10-CM

## 2017-08-03 DIAGNOSIS — F1290 Cannabis use, unspecified, uncomplicated: Secondary | ICD-10-CM

## 2017-08-03 MED ORDER — FLUCONAZOLE 150 MG PO TABS: ORAL_TABLET | ORAL | 0 refills | Status: DC

## 2017-08-03 MED ORDER — METRONIDAZOLE 500 MG PO TABS: 500.0000 mg | ORAL_TABLET | Freq: Two times a day (BID) | ORAL | 0 refills | Status: AC

## 2017-08-03 NOTE — Progress Notes (Signed)
C/o wants cx [email or web address] 

## 2017-08-04 ENCOUNTER — Encounter: Payer: Self-pay | Admitting: Certified Nurse Midwife

## 2017-08-04 DIAGNOSIS — F1290 Cannabis use, unspecified, uncomplicated: Secondary | ICD-10-CM | POA: Insufficient documentation

## 2017-08-04 NOTE — Progress Notes (Signed)
ROB at 37 weeks 1 day: Has several concerns. Has had cracking and bleeding of the perineal tissue with intercourse and is worried about lacerations with delivery and the lacerations not healing well (like her aunt). Has noticed more white discharge. No vulvar itching. Also has a couple of skin tags on her vulva and is interested in having them removed. Good FM. Having some BH contractions with some back pain.  Unable to tell presentation on Leopold's. Baby is vertex (LOP) on ultrasound. AFI 8.81 cm Pelvic exam: External BUS: no inflammation. Skin tab on left lower labia present Vagina: white homogenous discharge Wet prep: positive clue cells and mild amine odor. Negative hyphae and Trich GBS and Aptima today Cervix: closed/ 50%/-1/ soft RX: Flagyl 500 mgm BID x 7 days (no alcohol, take with food) Diflucan 150 mgm if develops yeast sx-one every 3 days x 2 doses (if needs stitches) or after pp recovery Discussed ways of decreasing risks of vulvar lacerations Discussed possible removal of skin tags after delivery Labor precautions ROB in 1 week Farrel Connersolleen Vishal Sandlin, CNM

## 2017-08-06 ENCOUNTER — Encounter (INDEPENDENT_AMBULATORY_CARE_PROVIDER_SITE_OTHER): Payer: Self-pay

## 2017-08-06 ENCOUNTER — Other Ambulatory Visit: Payer: Self-pay | Admitting: Certified Nurse Midwife

## 2017-08-06 LAB — CHLAMYDIA/GONOCOCCUS/TRICHOMONAS, NAA
CHLAMYDIA BY NAA: NEGATIVE
GONOCOCCUS BY NAA: NEGATIVE
Trich vag by NAA: NEGATIVE

## 2017-08-06 LAB — URINE DRUG PANEL 7
AMPHETAMINES, URINE: NEGATIVE ng/mL
Barbiturate Quant, Ur: NEGATIVE ng/mL
Benzodiazepine Quant, Ur: NEGATIVE ng/mL
Cannabinoid Quant, Ur: NEGATIVE ng/mL
Cocaine (Metab.): NEGATIVE ng/mL
Opiate Quant, Ur: NEGATIVE ng/mL
PCP QUANT UR: NEGATIVE ng/mL

## 2017-08-06 LAB — STREP GP B NAA: STREP GROUP B AG: NEGATIVE

## 2017-08-06 MED ORDER — ONDANSETRON HCL 4 MG PO TABS: 4.0000 mg | ORAL_TABLET | Freq: Three times a day (TID) | ORAL | 0 refills | Status: DC | PRN

## 2017-08-09 ENCOUNTER — Ambulatory Visit (INDEPENDENT_AMBULATORY_CARE_PROVIDER_SITE_OTHER): Payer: Medicaid Other | Admitting: Advanced Practice Midwife

## 2017-08-09 ENCOUNTER — Encounter: Payer: Self-pay | Admitting: Advanced Practice Midwife

## 2017-08-09 VITALS — BP 110/68 | Wt 158.0 lb

## 2017-08-09 DIAGNOSIS — F339 Major depressive disorder, recurrent, unspecified: Secondary | ICD-10-CM

## 2017-08-09 DIAGNOSIS — O99343 Other mental disorders complicating pregnancy, third trimester: Secondary | ICD-10-CM

## 2017-08-09 DIAGNOSIS — Z3A38 38 weeks gestation of pregnancy: Secondary | ICD-10-CM

## 2017-08-09 NOTE — Progress Notes (Signed)
Routine Prenatal Care Visit  Subjective  Rachel Richards is a 22 y.o. G1P0 at 5271w0d being seen today for ongoing prenatal care.  She is currently monitored for the following issues for this low-risk pregnancy and has Chronic bilateral thoracic back pain; Depression; Acne; Supervision of normal first pregnancy, antepartum; and Marijuana use on their problem list.  ----------------------------------------------------------------------------------- Patient reports no complaints.   Contractions: Irregular. Vag. Bleeding: None.  Movement: Present. Denies leaking of fluid.  ----------------------------------------------------------------------------------- The following portions of the patient's history were reviewed and updated as appropriate: allergies, current medications, past family history, past medical history, past social history, past surgical history and problem list. Problem list updated.   Objective  Blood pressure 110/68, weight 158 lb (71.7 kg), last menstrual period 11/16/2016. Pregravid weight 132 lb (59.9 kg) Total Weight Gain 26 lb (11.8 kg) Urinalysis:      Fetal Status: Fetal Heart Rate (bpm): 140 Fundal Height: 37 cm Movement: Present     General:  Alert, oriented and cooperative. Patient is in no acute distress.  Skin: Skin is warm and dry. No rash noted.   Cardiovascular: Normal heart rate noted  Respiratory: Normal respiratory effort, no problems with respiration noted  Abdomen: Soft, gravid, appropriate for gestational age. Pain/Pressure: Present     Pelvic:  Cervical exam deferred        Extremities: Normal range of motion.  Edema: None  Mental Status: Normal mood and affect. Normal behavior. Normal judgment and thought content.   Assessment   22 y.o. G1P0 at 4871w0d by  08/23/2017, by Last Menstrual Period presenting for routine prenatal visit  Plan   FIRST Problems (from 01/17/17 to present)    Problem Noted Resolved   Supervision of normal first pregnancy,  antepartum 01/17/2017 by Oswaldo ConroySchmid, Jacelyn Y, CNM No   Overview Addendum 08/04/2017 10:35 AM by Farrel ConnersGutierrez, Colleen, CNM    [facility]inic [facility] Prenatal Labs  Dating LMP= 9wk US Blood type: O/Positive/-- (11/29 0000) O positive  Genetic Screen Declines Antibody:Negative (11/29 0000) negative  Anatomic US  Rubella: 4.91 (12/12 1059)Immune Varicella:  immune  GTT Early:               Third trimester:  RPR: Nonreactive (11/29 0000) Nonreactive  Rhogam  Not applicable HBsAg: Negative (11/29 0000) Negative  TDaP vaccine     06/20/17                  Flu Shot: received at health department HIV: Non Reactive (02/11 1443)   Baby Food  Breast                              GBS:   Contraception  Given info- considering Depo Pap: done 03/26/17  CBB   Given Info   CS/VBAC  not relevant   Support Person              Depression 10/14/2014 by Oswaldo ConroySchmid, Jacelyn Y, CNM No   Overview Signed 01/17/2017  1:58 PM by Oswaldo ConroySchmid, Jacelyn Y, CNM    Overview:  Failed prozac. Wellbutrin started 8/16          Term labor symptoms and general obstetric precautions including but not limited to vaginal bleeding, contractions, leaking of fluid and fetal movement were reviewed in detail with the patient. Please refer to After Visit Summary for other counseling recommendations.   Return in about 1 week (around 08/16/2017) for rob.  Tresea MallJane Vernia Teem, CNM 08/09/2017 1:58 PM

## 2017-08-09 NOTE — Patient Instructions (Signed)
Vaginal Delivery Vaginal delivery means that you will give birth by pushing your baby out of your birth canal (vagina). A team of health care providers will help you before, during, and after vaginal delivery. Birth experiences are unique for every woman and every pregnancy, and birth experiences vary depending on where you choose to give birth. What should I do to prepare for my baby's birth? Before your baby is born, it is important to talk with your health care provider about:  Your labor and delivery preferences. These may include: ? Medicines that you may be given. ? How you will manage your pain. This might include non-medical pain relief techniques or injectable pain relief such as epidural analgesia. ? How you and your baby will be monitored during labor and delivery. ? Who may be in the labor and delivery room with you. ? Your feelings about surgical delivery of your baby (cesarean delivery, or C-section) if this becomes necessary. ? Your feelings about receiving donated blood through an IV tube (blood transfusion) if this becomes necessary.  Whether you are able: ? To take pictures or videos of the birth. ? To eat during labor and delivery. ? To move around, walk, or change positions during labor and delivery.  What to expect after your baby is born, such as: ? Whether delayed umbilical cord clamping and cutting is offered. ? Who will care for your baby right after birth. ? Medicines or tests that may be recommended for your baby. ? Whether breastfeeding is supported in your hospital or birth center. ? How long you will be in the hospital or birth center.  How any medical conditions you have may affect your baby or your labor and delivery experience.  To prepare for your baby's birth, you should also:  Attend all of your health care visits before delivery (prenatal visits) as recommended by your health care provider. This is important.  Prepare your home for your baby's  arrival. Make sure that you have: ? Diapers. ? Baby clothing. ? Feeding equipment. ? Safe sleeping arrangements for you and your baby.  Install a car seat in your vehicle. Have your car seat checked by a certified car seat installer to make sure that it is installed safely.  Think about who will help you with your new baby at home for at least the first several weeks after delivery.  What can I expect when I arrive at the birth center or hospital? Once you are in labor and have been admitted into the hospital or birth center, your health care provider may:  Review your pregnancy history and any concerns you have.  Insert an IV tube into one of your veins. This is used to give you fluids and medicines.  Check your blood pressure, pulse, temperature, and heart rate (vital signs).  Check whether your bag of water (amniotic sac) has broken (ruptured).  Talk with you about your birth plan and discuss pain control options.  Monitoring Your health care provider may monitor your contractions (uterine monitoring) and your baby's heart rate (fetal monitoring). You may need to be monitored:  Often, but not continuously (intermittently).  All the time or for long periods at a time (continuously). Continuous monitoring may be needed if: ? You are taking certain medicines, such as medicine to relieve pain or make your contractions stronger. ? You have pregnancy or labor complications.  Monitoring may be done by:  Placing a special stethoscope or a handheld monitoring device on your abdomen to   check your baby's heartbeat, and feeling your abdomen for contractions. This method of monitoring does not continuously record your baby's heartbeat or your contractions.  Placing monitors on your abdomen (external monitors) to record your baby's heartbeat and the frequency and length of contractions. You may not have to wear external monitors all the time.  Placing monitors inside of your uterus  (internal monitors) to record your baby's heartbeat and the frequency, length, and strength of your contractions. ? Your health care provider may use internal monitors if he or she needs more information about the strength of your contractions or your baby's heart rate. ? Internal monitors are put in place by passing a thin, flexible wire through your vagina and into your uterus. Depending on the type of monitor, it may remain in your uterus or on your baby's head until birth. ? Your health care provider will discuss the benefits and risks of internal monitoring with you and will ask for your permission before inserting the monitors.  Telemetry. This is a type of continuous monitoring that can be done with external or internal monitors. Instead of having to stay in bed, you are able to move around during telemetry. Ask your health care provider if telemetry is an option for you.  Physical exam Your health care provider may perform a physical exam. This may include:  Checking whether your baby is positioned: ? With the head toward your vagina (head-down). This is most common. ? With the head toward the top of your uterus (head-up or breech). If your baby is in a breech position, your health care provider may try to turn your baby to a head-down position so you can deliver vaginally. If it does not seem that your baby can be born vaginally, your provider may recommend surgery to deliver your baby. In rare cases, you may be able to deliver vaginally if your baby is head-up (breech delivery). ? Lying sideways (transverse). Babies that are lying sideways cannot be delivered vaginally.  Checking your cervix to determine: ? Whether it is thinning out (effacing). ? Whether it is opening up (dilating). ? How low your baby has moved into your birth canal.  What are the three stages of labor and delivery?  Normal labor and delivery is divided into the following three stages: Stage 1  Stage 1 is the  longest stage of labor, and it can last for hours or days. Stage 1 includes: ? Early labor. This is when contractions may be irregular, or regular and mild. Generally, early labor contractions are more than 10 minutes apart. ? Active labor. This is when contractions get longer, more regular, more frequent, and more intense. ? The transition phase. This is when contractions happen very close together, are very intense, and may last longer than during any other part of labor.  Contractions generally feel mild, infrequent, and irregular at first. They get stronger, more frequent (about every 2-3 minutes), and more regular as you progress from early labor through active labor and transition.  Many women progress through stage 1 naturally, but you may need help to continue making progress. If this happens, your health care provider may talk with you about: ? Rupturing your amniotic sac if it has not ruptured yet. ? Giving you medicine to help make your contractions stronger and more frequent.  Stage 1 ends when your cervix is completely dilated to 4 inches (10 cm) and completely effaced. This happens at the end of the transition phase. Stage 2  Once   your cervix is completely effaced and dilated to 4 inches (10 cm), you may start to feel an urge to push. It is common for the body to naturally take a rest before feeling the urge to push, especially if you received an epidural or certain other pain medicines. This rest period may last for up to 1-2 hours, depending on your unique labor experience.  During stage 2, contractions are generally less painful, because pushing helps relieve contraction pain. Instead of contraction pain, you may feel stretching and burning pain, especially when the widest part of your baby's head passes through the vaginal opening (crowning).  Your health care provider will closely monitor your pushing progress and your baby's progress through the vagina during stage 2.  Your  health care provider may massage the area of skin between your vaginal opening and anus (perineum) or apply warm compresses to your perineum. This helps it stretch as the baby's head starts to crown, which can help prevent perineal tearing. ? In some cases, an incision may be made in your perineum (episiotomy) to allow the baby to pass through the vaginal opening. An episiotomy helps to make the opening of the vagina larger to allow more room for the baby to fit through.  It is very important to breathe and focus so your health care provider can control the delivery of your baby's head. Your health care provider may have you decrease the intensity of your pushing, to help prevent perineal tearing.  After delivery of your baby's head, the shoulders and the rest of the body generally deliver very quickly and without difficulty.  Once your baby is delivered, the umbilical cord may be cut right away, or this may be delayed for 1-2 minutes, depending on your baby's health. This may vary among health care providers, hospitals, and birth centers.  If you and your baby are healthy enough, your baby may be placed on your chest or abdomen to help maintain the baby's temperature and to help you bond with each other. Some mothers and babies start breastfeeding at this time. Your health care team will dry your baby and help keep your baby warm during this time.  Your baby may need immediate care if he or she: ? Showed signs of distress during labor. ? Has a medical condition. ? Was born too early (prematurely). ? Had a bowel movement before birth (meconium). ? Shows signs of difficulty transitioning from being inside the uterus to being outside of the uterus. If you are planning to breastfeed, your health care team will help you begin a feeding. Stage 3  The third stage of labor starts immediately after the birth of your baby and ends after you deliver the placenta. The placenta is an organ that develops  during pregnancy to provide oxygen and nutrients to your baby in the womb.  Delivering the placenta may require some pushing, and you may have mild contractions. Breastfeeding can stimulate contractions to help you deliver the placenta.  After the placenta is delivered, your uterus should tighten (contract) and become firm. This helps to stop bleeding in your uterus. To help your uterus contract and to control bleeding, your health care provider may: ? Give you medicine by injection, through an IV tube, by mouth, or through your rectum (rectally). ? Massage your abdomen or perform a vaginal exam to remove any blood clots that are left in your uterus. ? Empty your bladder by placing a thin, flexible tube (catheter) into your bladder. ? Encourage   you to breastfeed your baby. After labor is over, you and your baby will be monitored closely to ensure that you are both healthy until you are ready to go home. Your health care team will teach you how to care for yourself and your baby. This information is not intended to replace advice given to you by your health care provider. Make sure you discuss any questions you have with your health care provider. Document Released: 11/09/2007 Document Revised: 08/20/2015 Document Reviewed: 02/14/2015 Elsevier Interactive Patient Education  2018 [employer]  

## 2017-08-17 ENCOUNTER — Inpatient Hospital Stay
Admission: EM | Admit: 2017-08-17 | Discharge: 2017-08-20 | DRG: 806 | Disposition: A | Discharge status: Home / Self Care | Payer: Medicaid Other | Attending: Certified Nurse Midwife | Admitting: Certified Nurse Midwife

## 2017-08-17 ENCOUNTER — Encounter: Payer: Self-pay | Admitting: Obstetrics and Gynecology

## 2017-08-17 ENCOUNTER — Other Ambulatory Visit: Payer: Self-pay

## 2017-08-17 ENCOUNTER — Ambulatory Visit (INDEPENDENT_AMBULATORY_CARE_PROVIDER_SITE_OTHER): Payer: Medicaid Other | Admitting: Obstetrics and Gynecology

## 2017-08-17 VITALS — BP 102/70 | Wt 157.0 lb

## 2017-08-17 DIAGNOSIS — D62 Acute posthemorrhagic anemia: Secondary | ICD-10-CM | POA: Diagnosis not present

## 2017-08-17 DIAGNOSIS — O4103X0 Oligohydramnios, third trimester, not applicable or unspecified: Principal | ICD-10-CM | POA: Diagnosis present

## 2017-08-17 DIAGNOSIS — Z3A39 39 weeks gestation of pregnancy: Secondary | ICD-10-CM

## 2017-08-17 DIAGNOSIS — O26843 Uterine size-date discrepancy, third trimester: Secondary | ICD-10-CM

## 2017-08-17 DIAGNOSIS — Z3400 Encounter for supervision of normal first pregnancy, unspecified trimester: Secondary | ICD-10-CM

## 2017-08-17 DIAGNOSIS — O9081 Anemia of the puerperium: Secondary | ICD-10-CM | POA: Diagnosis not present

## 2017-08-17 DIAGNOSIS — O4100X0 Oligohydramnios, unspecified trimester, not applicable or unspecified: Secondary | ICD-10-CM | POA: Diagnosis present

## 2017-08-17 HISTORY — DX: Other chronic pain: G89.29

## 2017-08-17 HISTORY — DX: Major depressive disorder, single episode, unspecified: F32.9

## 2017-08-17 HISTORY — DX: Depression, unspecified: F32.A

## 2017-08-17 HISTORY — DX: Cannabis use, unspecified, uncomplicated: F12.90

## 2017-08-17 HISTORY — DX: Pain in thoracic spine: M54.6

## 2017-08-17 LAB — CBC
HEMATOCRIT: 37.1 % (ref 35.0–47.0)
HEMOGLOBIN: 13 g/dL (ref 12.0–16.0)
MCH: 32.4 pg (ref 26.0–34.0)
MCHC: 35.2 g/dL (ref 32.0–36.0)
MCV: 92.2 fL (ref 80.0–100.0)
PLATELETS: 212 10*3/uL (ref 150–440)
RBC: 4.02 MIL/uL (ref 3.80–5.20)
RDW: 13.7 % (ref 11.5–14.5)
WBC: 9.2 10*3/uL (ref 3.6–11.0)

## 2017-08-17 LAB — URINE DRUG SCREEN, QUALITATIVE (ARMC ONLY)
Amphetamines, Ur Screen: NOT DETECTED
Benzodiazepine, Ur Scrn: NOT DETECTED
COCAINE METABOLITE, UR ~~LOC~~: NOT DETECTED
Cannabinoid 50 Ng, Ur ~~LOC~~: NOT DETECTED
MDMA (Ecstasy)Ur Screen: NOT DETECTED
Methadone Scn, Ur: NOT DETECTED
OPIATE, UR SCREEN: NOT DETECTED
PHENCYCLIDINE (PCP) UR S: NOT DETECTED
Tricyclic, Ur Screen: NOT DETECTED

## 2017-08-17 LAB — CHLAMYDIA/NGC RT PCR (ARMC ONLY)
CHLAMYDIA TR: NOT DETECTED
N gonorrhoeae: NOT DETECTED

## 2017-08-17 LAB — TYPE AND SCREEN
ABO/RH(D): O POS
Antibody Screen: NEGATIVE

## 2017-08-17 MED ORDER — OXYTOCIN 40 UNITS IN LACTATED RINGERS INFUSION - SIMPLE MED
2.5000 [IU]/h | INTRAVENOUS | Status: DC
  Administered 2017-08-18: 2.5 [IU]/h via INTRAVENOUS
  Filled 2017-08-17 (×2): qty 1000

## 2017-08-17 MED ORDER — SODIUM CHLORIDE FLUSH 0.9 % IV SOLN
INTRAVENOUS | Status: AC
  Filled 2017-08-17: qty 10

## 2017-08-17 MED ORDER — LACTATED RINGERS IV SOLN: 500.0000 mL | INTRAVENOUS | Status: DC | PRN

## 2017-08-17 MED ORDER — ONDANSETRON HCL 4 MG/2ML IJ SOLN: 4.0000 mg | Freq: Four times a day (QID) | INTRAMUSCULAR | Status: DC | PRN

## 2017-08-17 MED ORDER — LACTATED RINGERS IV SOLN
INTRAVENOUS | Status: DC
  Administered 2017-08-18 (×2): via INTRAVENOUS

## 2017-08-17 MED ORDER — LIDOCAINE HCL (PF) 1 % IJ SOLN
30.0000 mL | INTRAMUSCULAR | Status: DC | PRN
  Filled 2017-08-17: qty 30

## 2017-08-17 MED ORDER — OXYTOCIN BOLUS FROM INFUSION
500.0000 mL | Freq: Once | INTRAVENOUS | Status: AC
  Administered 2017-08-18: 500 mL via INTRAVENOUS

## 2017-08-17 MED ORDER — OXYTOCIN 40 UNITS IN LACTATED RINGERS INFUSION - SIMPLE MED
1.0000 m[IU]/min | INTRAVENOUS | Status: DC
  Administered 2017-08-17: 2 m[IU]/min via INTRAVENOUS

## 2017-08-17 MED ORDER — TERBUTALINE SULFATE 1 MG/ML IJ SOLN: 0.2500 mg | Freq: Once | INTRAMUSCULAR | Status: DC | PRN

## 2017-08-17 MED ORDER — MISOPROSTOL 200 MCG PO TABS
ORAL_TABLET | ORAL | Status: AC
  Filled 2017-08-17: qty 4

## 2017-08-17 MED ORDER — SODIUM CHLORIDE 0.9 % IJ SOLN
INTRAMUSCULAR | Status: AC
  Filled 2017-08-17: qty 50

## 2017-08-17 NOTE — H&P (Signed)
OB History & Physical   History of Present Illness:  Chief Complaint:   HPI:  Rachel Richards is a 22 y.o. G1P0 female with EDC 7/11/2019at [redacted]w[redacted]d dated by LMP 11/16/2016 and c/w 9wk4d ultrasound.  Her pregnancy has been complicated by MJ use early in pregnancy and for a history of depression.  She presents to L&D for induction of labor due to oligo hydraminos. AN AFI today was 4 cm. She denies leakage of fluid from the vagina. Baby active. No vaginal bleeding.    Prenatal care site: Prenatal care at [facility] has been remarkable for   [facility] Prenatal Labs  Dating LMP= 9wk [place] Blood type: O/Positive/-- (11/29 0000) O positive  Genetic Screen Declines Antibody:Negative (11/29 0000) negative  Anatomic [place] Normal but incomplete Rubella: 4.91 (12/12 1059)Immune Varicella:  immune  GTT  Third trimester: 125 RPR: Non Reactive (04/24 1146) Nonreactive  Rhogam  Not applicable HBsAg: Negative (11/29 0000) Negative  TDaP vaccine     06/20/17                  Flu Shot: received at health department HIV: Non Reactive (04/24 1146)   Baby Food  Breast                              [id number] (06/21 1557)  Contraception  Given info- considering Depo Pap: done 03/26/17  CBB   Given Info   CS/VBAC  not relevant   Support Person            Maternal Medical History:   Past Medical History:  Diagnosis Date  . Chronic thoracic back pain   . Depression   . Marijuana use     Past Surgical History:  Procedure Laterality Date  . HAND SURGERY Right 2016   repair broken hand  . WISDOM TOOTH EXTRACTION  2014   all four    No Known Allergies  Medications:  No current facility-administered medications on file prior to encounter.    Current Outpatient Medications on File Prior to Encounter  Medication Sig Dispense Refill  . Prenat-FeAsp-Meth-FA-DHA w/o A (PRENATE PIXIE) 10-0.6-0.4-200 MG CAPS Take 1 capsule by mouth daily. 30 capsule 6      Social History: She  reports that  she has never smoked. She has never used smokeless tobacco. She reports that she does not drink alcohol or use drugs. + MJ use early in pregnancy.  Family History: family history includes Colon cancer (age of onset: 27) in her maternal uncle.   Review of Systems: Negative x 10 systems reviewed except as noted in the HPI.      Physical Exam:  Vital Signs: BP 135/89 (BP Location: Left Arm)   Pulse 85   Temp 99 F (37.2 C) (Oral)   Resp 18   Ht 5\' 2"  (1.575 m)   Wt 71.2 kg (157 lb)   LMP 11/16/2016 (Exact Date)   SpO2 100%   BMI 28.72 kg/m  General: no acute distress.  HEENT: normocephalic, atraumatic Heart: regular rate & rhythm.  No murmurs Lungs: clear to auscultation bilaterally Abdomen: soft, gravid, non-tender, cephalic presentation EFW: 6#13oz Pelvic:   External: Normal external female genitalia  Cervix:1/80%/-1/ posterior/ medium  Extremities: non-tender, symmetric, no edema bilaterally.  DTRs: +2  Neurologic: Alert & oriented x 3.   FHR: 135 baseline with accelerations to 160s to 170s, moderate variability Toco: irregular, mild contractions  Assessment:  Rachel Richards is a 22 y.o. G1P0 female at 5886w1d with oligo hydraminos for IOL Bishop score 7 FWB: Cat 1    Plan:  1. Admit to Labor & Delivery  2. CBC, T&S, Clrs, IVF 3. GBS negative.   4. Consents obtained. 5. Plan Pitocin IOL: Discussed risks of induction including hyperstimulation, fetal intolerance to labor, FTP, and Cesarean section. Explained  Induction process. Plan on adding foley bulb once she is contracting and there is a negative CST. 6. Patient verbalizes understanding and wishes to proceed. 7. Requests duola. Unsure regarding other pain relief measures at this time.   Farrel ConnersColleen Braeden Dolinski  08/17/2017 10:43 PM

## 2017-08-17 NOTE — Progress Notes (Addendum)
Routine Prenatal Care Visit  Subjective  Rachel Richards is a 22 y.o. G1P0 at 6175w1d being seen today for ongoing prenatal care.  She is currently monitored for the following issues for this low-risk pregnancy and has Chronic bilateral thoracic back pain; Depression; Acne; Supervision of normal first pregnancy, antepartum; and Marijuana use on their problem list.  ----------------------------------------------------------------------------------- Patient reports no complaints.    Contractions: Irregular. Vag. Bleeding: None.  Movement: Present. Denies leaking of fluid.  ----------------------------------------------------------------------------------- The following portions of the patient's history were reviewed and updated as appropriate: allergies, current medications, past family history, past medical history, past social history, past surgical history and problem list. Problem list updated.   Objective  Blood pressure 102/70, weight 157 lb (71.2 kg), last menstrual period 11/16/2016. Pregravid weight 132 lb (59.9 kg) Total Weight Gain 25 lb (11.3 kg) Urinalysis: Urine Protein: Negative(Simultaneous filing. User may not have seen previous data.) Urine Glucose: Negative(Simultaneous filing. User may not have seen previous data.)  Fetal Status: Fetal Heart Rate (bpm): 135 Fundal Height: 37 cm Movement: Present  Presentation: Vertex  General:  Alert, oriented and cooperative. Patient is in no acute distress.  Skin: Skin is warm and dry. No rash noted.   Cardiovascular: Normal heart rate noted  Respiratory: Normal respiratory effort, no problems with respiration noted  Abdomen: Soft, gravid, appropriate for gestational age. Pain/Pressure: Absent     Pelvic:  Cervical exam performed Dilation: 1 Effacement (%): 80 Station: -3  Extremities: Normal range of motion.  Edema: None  ental Status: Normal mood and affect. Normal behavior. Normal judgment and thought content.     Assessment     22 y.o. G1P0 at 8875w1d by  08/23/2017, by Last Menstrual Period presenting for routine prenatal visit  Plan   FIRST Problems (from 01/17/17 to present)    Problem Noted Resolved   Supervision of normal first pregnancy, antepartum 01/17/2017 by Oswaldo ConroySchmid, Jacelyn Y, CNM No   Overview Addendum 08/17/2017  3:05 PM by Natale MilchSchuman, Camdon Saetern R, MD    [facility] Prenatal Labs  Dating LMP= 9wk US Blood type: O/Positive/-- (11/29 0000) O positive  Genetic Screen Declines Antibody:Negative (11/29 0000) negative  Anatomic US Normal but incomplete Rubella: 4.91 (12/12 1059)Immune Varicella:  immune  GTT  Third trimester: 125 RPR: Non Reactive (04/24 1146) Nonreactive  Rhogam  Not applicable HBsAg: Negative (11/29 0000) Negative  TDaP vaccine     06/20/17                  Flu Shot: received at health department HIV: Non Reactive (04/24 1146)   Baby Food  Breast                              [id number]:Negative (06/21 1557)  Contraception  Given info- considering Depo Pap: done 03/26/17  CBB   Given Info   CS/VBAC  not relevant   Support Person              Depression 10/14/2014 by Oswaldo ConroySchmid, Jacelyn Y, CNM No   Overview Signed 01/17/2017  1:58 PM by Oswaldo ConroySchmid, Jacelyn Y, CNM    Overview:  Failed prozac. Wellbutrin started 8/16          Gestational age appropriate obstetric precautions including but not limited to vaginal bleeding, contractions, leaking of fluid and fetal movement were reviewed in detail with the patient.    Membranes swept at maternal request. Fundal height less than gestational  age, no [place] tech available in office,   Hospital could not schedule for growth and AFI [place] until Monday. My bedside AFI was 4cm. Infant vertex.   Patient to go to L&D for induction.  Return in about 1 week (around 08/24/2017), or if symptoms worsen or fail to improve.  Adelene Idler MD [facility], [facility] 08/17/17 4:07 PM

## 2017-08-17 NOTE — Progress Notes (Signed)
ROB No concerns 

## 2017-08-18 ENCOUNTER — Inpatient Hospital Stay: Payer: Medicaid Other | Admitting: Anesthesiology

## 2017-08-18 ENCOUNTER — Encounter: Payer: Self-pay | Admitting: Anesthesiology

## 2017-08-18 DIAGNOSIS — O4103X0 Oligohydramnios, third trimester, not applicable or unspecified: Secondary | ICD-10-CM

## 2017-08-18 DIAGNOSIS — Z3A39 39 weeks gestation of pregnancy: Secondary | ICD-10-CM

## 2017-08-18 MED ORDER — ONDANSETRON HCL 4 MG/2ML IJ SOLN: 4.0000 mg | INTRAMUSCULAR | Status: DC | PRN

## 2017-08-18 MED ORDER — ACETAMINOPHEN 325 MG PO TABS: 650.0000 mg | ORAL_TABLET | ORAL | Status: DC | PRN

## 2017-08-18 MED ORDER — WITCH HAZEL-GLYCERIN EX PADS: 1.0000 "application " | MEDICATED_PAD | CUTANEOUS | Status: DC | PRN

## 2017-08-18 MED ORDER — DIPHENHYDRAMINE HCL 50 MG/ML IJ SOLN: 12.5000 mg | INTRAMUSCULAR | Status: DC | PRN

## 2017-08-18 MED ORDER — DIPHENHYDRAMINE HCL 25 MG PO CAPS: 25.0000 mg | ORAL_CAPSULE | Freq: Four times a day (QID) | ORAL | Status: DC | PRN

## 2017-08-18 MED ORDER — FENTANYL 2.5 MCG/ML W/ROPIVACAINE 0.15% IN NS 100 ML EPIDURAL (ARMC)
12.0000 mL/h | EPIDURAL | Status: DC
  Administered 2017-08-18: 12 mL/h via EPIDURAL
  Filled 2017-08-18: qty 100

## 2017-08-18 MED ORDER — HYDROCODONE-ACETAMINOPHEN 5-325 MG PO TABS: 2.0000 | ORAL_TABLET | ORAL | Status: DC | PRN

## 2017-08-18 MED ORDER — DIBUCAINE 1 % RE OINT: 1.0000 "application " | TOPICAL_OINTMENT | RECTAL | Status: DC | PRN

## 2017-08-18 MED ORDER — COCONUT OIL OIL
1.0000 "application " | TOPICAL_OIL | Status: DC | PRN
  Filled 2017-08-18: qty 120

## 2017-08-18 MED ORDER — FERROUS SULFATE 325 (65 FE) MG PO TABS
325.0000 mg | ORAL_TABLET | Freq: Two times a day (BID) | ORAL | Status: DC
  Administered 2017-08-18 – 2017-08-20 (×4): 325 mg via ORAL
  Filled 2017-08-18 (×5): qty 1

## 2017-08-18 MED ORDER — BUTORPHANOL TARTRATE 2 MG/ML IJ SOLN
INTRAMUSCULAR | Status: AC
  Administered 2017-08-18: 1 mg via INTRAVENOUS
  Filled 2017-08-18: qty 1

## 2017-08-18 MED ORDER — ONDANSETRON HCL 4 MG PO TABS: 4.0000 mg | ORAL_TABLET | ORAL | Status: DC | PRN

## 2017-08-18 MED ORDER — LACTATED RINGERS IV SOLN: 500.0000 mL | Freq: Once | INTRAVENOUS | Status: DC

## 2017-08-18 MED ORDER — BUTORPHANOL TARTRATE 2 MG/ML IJ SOLN
1.0000 mg | INTRAMUSCULAR | Status: DC | PRN
  Administered 2017-08-18 (×2): 1 mg via INTRAVENOUS
  Filled 2017-08-18: qty 1

## 2017-08-18 MED ORDER — BENZOCAINE-MENTHOL 20-0.5 % EX AERO
1.0000 "application " | INHALATION_SPRAY | CUTANEOUS | Status: DC | PRN
  Filled 2017-08-18: qty 56

## 2017-08-18 MED ORDER — HYDROCODONE-ACETAMINOPHEN 5-325 MG PO TABS: 1.0000 | ORAL_TABLET | ORAL | Status: DC | PRN

## 2017-08-18 MED ORDER — FENTANYL 2.5 MCG/ML W/ROPIVACAINE 0.15% IN NS 100 ML EPIDURAL (ARMC)
EPIDURAL | Status: AC
  Filled 2017-08-18: qty 100

## 2017-08-18 MED ORDER — PRENATAL MULTIVITAMIN CH
1.0000 | ORAL_TABLET | Freq: Every day | ORAL | Status: DC
  Administered 2017-08-19 – 2017-08-20 (×2): 1 via ORAL
  Filled 2017-08-18 (×2): qty 1

## 2017-08-18 MED ORDER — SIMETHICONE 80 MG PO CHEW: 80.0000 mg | CHEWABLE_TABLET | ORAL | Status: DC | PRN

## 2017-08-18 MED ORDER — EPHEDRINE 5 MG/ML INJ: 10.0000 mg | INTRAVENOUS | Status: DC | PRN

## 2017-08-18 MED ORDER — PHENYLEPHRINE 40 MCG/ML (10ML) SYRINGE FOR IV PUSH (FOR BLOOD PRESSURE SUPPORT): 80.0000 ug | PREFILLED_SYRINGE | INTRAVENOUS | Status: DC | PRN

## 2017-08-18 MED ORDER — IBUPROFEN 600 MG PO TABS
600.0000 mg | ORAL_TABLET | Freq: Four times a day (QID) | ORAL | Status: DC
  Administered 2017-08-18 – 2017-08-19 (×2): 600 mg via ORAL
  Filled 2017-08-18 (×3): qty 1

## 2017-08-18 MED ORDER — BUPIVACAINE HCL (PF) 0.25 % IJ SOLN
INTRAMUSCULAR | Status: DC | PRN
  Administered 2017-08-18: 5 mL via EPIDURAL

## 2017-08-18 MED ORDER — SENNOSIDES-DOCUSATE SODIUM 8.6-50 MG PO TABS
2.0000 | ORAL_TABLET | ORAL | Status: DC
  Administered 2017-08-19 – 2017-08-20 (×2): 2 via ORAL
  Filled 2017-08-18 (×2): qty 2

## 2017-08-18 NOTE — Discharge Summary (Signed)
OB Discharge Summary     Patient Name: Rachel Richards DOB: 10/21/1995 MRN: [id number]  Date of admission: 08/17/2017 Delivering provider: Oswaldo Conroy, CNM  Date of Delivery: 08/17/2017  Date of discharge: 08/20/2017  Admitting diagnosis: 39 wks, IOL for oligohydramnios Intrauterine pregnancy: [redacted]w[redacted]d     Secondary diagnosis: None     Discharge diagnosis: Term Pregnancy Delivered, oligohydramnios                         Hospital course:  Induction of Labor With Vaginal Delivery   22 y.o. yo G1P1001 at [redacted]w[redacted]d was admitted to the hospital 08/17/2017 for induction of labor.  Indication for induction: oligohydramnios.  Patient had an uncomplicated labor course as follows: Membrane Rupture Time/Date: 9:23 AM ,08/18/2017   Intrapartum Procedures: Episiotomy:                                           Lacerations:     Patient had delivery of a Viable infant.  Information for the patient's newborn:  Akeema, Broder [[id number]]  Delivery Method: Vaginal, Spontaneous(Filed from Delivery Summary)   08/18/2017  Details of delivery can be found in separate delivery note.  Patient had a routine postpartum course. Patient is discharged home 08/20/17.                                                                 Post partum procedures:none  Complications: None  Physical exam on 08/20/2017: Vitals:   08/19/17 1625 08/19/17 1925 08/20/17 0000 08/20/17 0712  BP: 134/81 116/80 122/80 123/78  Pulse: 71 82 66 70  Resp: 18 18 18 18   Temp: 98.4 F (36.9 C) 98.4 F (36.9 C) 98 F (36.7 C) 98.3 F (36.8 C)  TempSrc: Oral Oral Oral Oral  SpO2:  100% 99% 98%  Weight:      Height:       General: alert, cooperative and no distress Lochia: appropriate Uterine Fundus: firm Incision: N/A DVT Evaluation: No evidence of DVT seen on physical exam.  Labs: Lab Results  Component Value Date   WBC 10.1 08/19/2017   HGB 12.5 08/19/2017   HCT 35.6 08/19/2017   MCV 93.8 08/19/2017   PLT 187 08/19/2017    CMP Latest Ref Rng & Units 11/28/2012  Glucose 65 - 99 mg/dL -  BUN 9 - 21 mg/dL -  Creatinine 7.82 - 9.56 mg/dL -  Sodium 213 - 086 mmol/L -  Potassium 3.3 - 4.7 mmol/L -  Chloride 97 - 107 mmol/L -  CO2 16 - 25 mmol/L -  Calcium 9.0 - 10.7 mg/dL -  Total Protein 6.4 - 8.6 g/dL 8.0  Total Bilirubin 0.2 - 1.0 mg/dL 0.4  Alkaline Phos 82 - 169 Unit/L 84  AST 0 - 26 Unit/L 20  ALT 12 - 78 U/L 23    Discharge instruction: per After Visit Summary.  Medications:  Allergies as of 08/20/2017   No Known Allergies     Medication List    TAKE these medications   PRENATE PIXIE 10-0.6-0.4-200 MG Caps Take 1 capsule by mouth daily.  Diet: routine diet  Activity: Advance as tolerated. Pelvic rest for 6 weeks.   Outpatient follow up: Follow-up Information    Oswaldo ConroySchmid, Jacelyn Y, CNM. Schedule an appointment as soon as possible for a visit in 2 week(s).   Specialty:  Certified Nurse Midwife Why:  postpartum depression check Contact information: [address] [place] [address] [phone]             Postpartum contraception: Depo Provera- declines at discharge and will get first dose at 6 week Postpartum Rhogam Given postpartum: no Rubella vaccine given postpartum: no Varicella vaccine given postpartum: no TDaP given antepartum or postpartum: Yes  Newborn Data: Live born female  Birth Weight: 3040 g APGAR: 8, 9  Newborn Delivery   Birth date/time:  08/18/2017 14:21:00 Delivery type:  Vaginal, Spontaneous      Baby Feeding: Breast  Disposition:home with mother  SIGNED:  Tresea MallJane Lavanna Rog, CNM 08/20/2017 10:23 AM

## 2017-08-18 NOTE — Lactation Note (Signed)
This note was copied from a baby's chart. Lactation Consultation Note  Patient Name: Girl Josie SaundersMerissa Saxer ZOXWR'UToday's Date: 08/18/2017  Assisted mom with positioning in football hold on left breast with pillow support.  Demonstrated hand expression, breast massage and deep latch with good flanged lips.  Mom has copious amounts of colostrum reporting leaking milk for a long time.  Thera FlakeKensley has strong rhythmic sucking with frequent swallows with about every suck for 15 minutes.  Reviewed supply and demand, routine newborn feeding patterns and normal course of lactation.  Lactation name and number on white board and encouraged to call for questions, concerns or assistance.   Maternal Data    Feeding    LATCH Score                   Interventions    Lactation Tools Discussed/Used     Consult Status      Louis MeckelWilliams, Lamaria Hildebrandt Kay 08/18/2017, 8:16 PM

## 2017-08-18 NOTE — Progress Notes (Signed)
  Labor Progress Note   22 y.o. G1P0 @ 2370w2d , admitted for  Pregnancy, Labor Management. IOL for oligohydramnios  Subjective:  Received another epidural bolus from anesthesiologist and is starting to feel more comfortable with contractions now.  Objective:  BP (!) 141/93   Pulse 97   Temp 98.5 F (36.9 C) (Oral)   Resp 18   Ht 5\' 2"  (1.575 m)   Wt 157 lb (71.2 kg)   LMP 11/16/2016 (Exact Date)   SpO2 96%   BMI 28.72 kg/m    Abd: moderate Extr: trace to 1+ bilateral pedal edema SVE: 4.5/90/-1 to 0, AROM of forebag with small amount of clear fluid, some bloody show  EFM: FHR: 140 bpm, variability: moderate, accelerations:  Present,  Decelerations: Early Toco: Frequency: Every 2-2.5 minutes, Duration: 50-70 seconds and Intensity: moderate Labs: I have reviewed the patient's lab results.  Assessment & Plan:  G1P0 @ 7870w2d, admitted for  Pregnancy and Labor/Delivery Management  1. Pain management: epidural. 2. FWB: FHT category I.  3. ID: GBS negative 4. Labor management: Continue Pitocin, monitor for cervical change.  Marcelyn BruinsJacelyn Schmid, CNM 08/18/2017  8:43 AM

## 2017-08-18 NOTE — Anesthesia Preprocedure Evaluation (Signed)
Anesthesia Evaluation  Patient identified by MRN, date of birth, ID band Patient awake    Reviewed: Allergy & Precautions, NPO status , Patient's Chart, lab work & pertinent test results, reviewed documented beta blocker date and time   Airway Mallampati: II  TM Distance: >3 FB     Dental  (+) Chipped   Pulmonary           Cardiovascular      Neuro/Psych PSYCHIATRIC DISORDERS Depression    GI/Hepatic   Endo/Other    Renal/GU      Musculoskeletal   Abdominal   Peds  Hematology   Anesthesia Other Findings   Reproductive/Obstetrics                             Anesthesia Physical Anesthesia Plan  ASA: II  Anesthesia Plan: Epidural   Post-op Pain Management:    Induction:   PONV Risk Score and Plan:   Airway Management Planned:   Additional Equipment:   Intra-op Plan:   Post-operative Plan:   Informed Consent: I have reviewed the patients History and Physical, chart, labs and discussed the procedure including the risks, benefits and alternatives for the proposed anesthesia with the patient or authorized representative who has indicated his/her understanding and acceptance.       Plan Discussed with: CRNA  Anesthesia Plan Comments:         Anesthesia Quick Evaluation  

## 2017-08-18 NOTE — Plan of Care (Signed)
Patient delivered with epidural for pain relief, bonding well with infant

## 2017-08-18 NOTE — Progress Notes (Signed)
L&D Progress Note   S: Just received an epidural, but it is not working well yet. Still feeling contractions Foley fell out at about 0340 this AM. Nurse reports cervical exm at 5 AM was 4/90%/-1  O: BP (!) 141/93   Pulse 97   Temp 98.5 F (36.9 C) (Oral)   Resp 18   Ht 5\' 2"  (1.575 m)   Wt 71.2 kg (157 lb)   LMP 11/16/2016 (Exact Date)   SpO2 96%   BMI 28.72 kg/m    Patient Vitals for the past 24 hrs:  BP Temp Temp src Pulse Resp SpO2 Height Weight  08/18/17 0745 (!) 141/93 - - 97 - 96 % - -  08/18/17 0740 - - - - - 97 % - -  08/18/17 0735 - - - - - 98 % - -  08/18/17 0730 (!) 146/88 - - 75 - 98 % - -  08/18/17 0725 - - - - - 97 % - -  08/18/17 0720 - - - - - 97 % - -  08/18/17 0715 (!) 141/93 - - 70 - 97 % - -  08/18/17 0710 133/83 - - 76 - 98 % - -  08/18/17 0705 137/79 - - 84 - 100 % - -  08/18/17 0700 135/84 - - 72 - 99 % - -  08/18/17 0659 - - - - - 99 % - -  08/18/17 0658 (!) 113/92 - - (!) 193 - - - -  08/18/17 0650 - - - - - 98 % - -  08/18/17 0645 - - - - - 97 % - -  08/18/17 0514 129/81 98.5 F (36.9 C) Oral 72 18 - - -  08/18/17 0348 137/81 98.1 F (36.7 C) Oral 74 18 - - -  08/17/17 2350 121/73 98.3 F (36.8 C) Oral 78 16 - - -  08/17/17 2227 (!) 147/90 - - 79 17 - - -  08/17/17 2133 135/89 99 F (37.2 C) Oral 85 18 - - -  08/17/17 2105 140/83 - - 100 - - - -  08/17/17 2004 131/76 98.3 F (36.8 C) Oral 98 18 - - -  08/17/17 1754 138/81 98.4 F (36.9 C) Oral 99 18 100 % 5\' 2"  (1.575 m) 71.2 kg (157 lb)    General: concentrating thru the contraction, but appears to be in less pain then prior to epidural  FHR: 125 baseline with accelerations to 150s, moderate variability Toco: contractions every 2 minutes, Pitocin at 14 miu/min  Cervix: 4/90%/-1  A: Progressing Still in pain with contractions-give epidural a little more time. Notify anesthesia if epidural not working well. Occasional mild range blood pressure  P: AROM once comfortable/ consider  IUPC PIH labs if blood pressures not responding to pain relief.   Farrel Connersolleen Kelie Gainey, CNM

## 2017-08-18 NOTE — Anesthesia Procedure Notes (Signed)
Epidural Patient location during procedure: OB  Staffing Anesthesiologist: Kamdyn Covel, MD Performed: anesthesiologist   Preanesthetic Checklist Completed: patient identified, site marked, surgical consent, pre-op evaluation, timeout performed, IV checked, risks and benefits discussed and monitors and equipment checked  Epidural Patient position: sitting Prep: ChloraPrep Patient monitoring: heart rate, continuous pulse ox and blood pressure Approach: midline Location: L4-L5 Injection technique: LOR saline  Needle:  Needle type: Tuohy  Needle gauge: 18 G Needle length: 9 cm and 9 Catheter type: closed end flexible Catheter size: 20 Guage Test dose: negative and 1.5% lidocaine with Epi 1:200 K  Assessment Sensory level: T10 Events: blood not aspirated, injection not painful, no injection resistance, negative IV test and no paresthesia  Additional Notes   Patient tolerated the insertion well without complications.Reason for block:procedure for pain     

## 2017-08-18 NOTE — Progress Notes (Signed)
  Labor Progress Note   22 y.o. G1P0 @ 5066w2d , admitted for  Pregnancy, Labor Management. IOL for oligohydramnios  Subjective:  Comfortable with epidural, has vomited a few times is but feeling better at the moment.  Objective:  BP 137/79   Pulse 96   Temp 99.5 F (37.5 C) (Oral)   Resp 18   Ht 5\' 2"  (1.575 m)   Wt 157 lb (71.2 kg)   LMP 11/16/2016 (Exact Date)   SpO2 98%   BMI 28.72 kg/m  Abd: moderate Extr: trace to 1+ bilateral pedal edema SVE: 9/100/-1 to 0  EFM: FHR: 130 bpm, variability: moderate,  accelerations:  Present,  decelerations:  Present early Toco: Frequency: Every 2-3 minutes, Duration: 50-90 seconds and Intensity: moderate Labs: I have reviewed the patient's lab results.   Assessment & Plan:  G1P0 @ 2666w2d, admitted for  Pregnancy and Labor/Delivery Management  1. Pain management: epidural. 2. FWB: FHT category I.  3. ID: GBS negative 4. Labor management: peanut ball and position changes to encourage descent.  All discussed with patient.  Marcelyn BruinsJacelyn Schmid, CNM 08/18/2017  12:28 PM

## 2017-08-19 LAB — CBC
HCT: 35.6 % (ref 35.0–47.0)
HEMOGLOBIN: 12.5 g/dL (ref 12.0–16.0)
MCH: 33 pg (ref 26.0–34.0)
MCHC: 35.1 g/dL (ref 32.0–36.0)
MCV: 93.8 fL (ref 80.0–100.0)
PLATELETS: 187 10*3/uL (ref 150–440)
RBC: 3.8 MIL/uL (ref 3.80–5.20)
RDW: 13.9 % (ref 11.5–14.5)
WBC: 10.1 10*3/uL (ref 3.6–11.0)

## 2017-08-19 LAB — RPR: RPR Ser Ql: NONREACTIVE

## 2017-08-19 MED ORDER — IBUPROFEN 600 MG PO TABS
600.0000 mg | ORAL_TABLET | Freq: Four times a day (QID) | ORAL | Status: DC
  Administered 2017-08-19 – 2017-08-20 (×6): 600 mg via ORAL
  Filled 2017-08-19 (×6): qty 1

## 2017-08-19 MED ORDER — MEDROXYPROGESTERONE ACETATE 150 MG/ML IM SUSP
150.0000 mg | Freq: Once | INTRAMUSCULAR | Status: DC
  Filled 2017-08-19: qty 1

## 2017-08-19 NOTE — Progress Notes (Signed)
PPD#1 SVD Subjective:  Holding baby, comfortable.  Pain control is good with PRN medication. Voiding without difficulty. Tolerating a regular diet. Ambulating well.  Objective:   Blood pressure 134/81, pulse 71, temperature 98.4 F (36.9 C), temperature source Oral, resp. rate 18, height 5\' 2"  (1.575 m), weight 157 lb (71.2 kg), last menstrual period 11/16/2016, SpO2 98 %,currently breastfeeding.  General: NAD Pulmonary: no increased work of breathing Abdomen: non-distended, non-tender Uterus:  fundus firm; lochia appropriate Extremities: no edema, no erythema, no tenderness, no signs of DVT  Results for orders placed or performed during the hospital encounter of 08/17/17 (from the past 72 hour(s))  Chlamydia/NGC rt PCR ([facility] only)     Status: None   Collection Time: 08/17/17  5:59 PM  Result Value Ref Range   Specimen source GC/Chlam CHLAMYDIA SPECIES    Chlamydia Tr NOT DETECTED NOT DETECTED   N gonorrhoeae NOT DETECTED NOT DETECTED    Comment: (NOTE) 100  This methodology has not been evaluated in pregnant women or in 200  patients with a history of hysterectomy. 300 400  This methodology will not be performed on patients less than 6214  years of age. Performed at [facility], [address] [place], [place] [address]   CBC     Status: None   Collection Time: 08/17/17  6:04 PM  Result Value Ref Range   WBC 9.2 3.6 - 11.0 K/uL   RBC 4.02 3.80 - 5.20 MIL/uL   Hemoglobin 13.0 12.0 - 16.0 g/dL   HCT 24.437.1 01.035.0 - 27.247.0 %   MCV 92.2 80.0 - 100.0 fL   MCH 32.4 26.0 - 34.0 pg   MCHC 35.2 32.0 - 36.0 g/dL   RDW 53.613.7 64.411.5 - 03.414.5 %   Platelets 212 150 - 440 K/uL    Comment: Performed at [facility], [address] [place], [place] [address]  Type and screen [facility]     Status: None (Preliminary result)   Collection Time: 08/17/17  6:04 PM  Result Value Ref Range   ABO/RH(D) PENDING    Antibody Screen PENDING    Sample Expiration       08/20/2017 Performed at [facility], [address] [place], [place] [address]   RPR     Status: None   Collection Time: 08/17/17  6:04 PM  Result Value Ref Range   RPR Ser Ql Non Reactive Non Reactive    Comment: (NOTE) Performed At: [facility] [address] [place], [place] [id number] Jolene SchimkeNagendra Sanjai MD OA:[phone] Performed at [facility], [address] [place], [place] [address]   Type and screen     Status: None   Collection Time: 08/17/17  6:39 PM  Result Value Ref Range   ABO/RH(D) O POS    Antibody Screen NEG    Sample Expiration      08/20/2017 Performed at [facility], [address] [place], [place] [address]   Urine Drug Screen, Qualitative ([facility] only)     Status: Abnormal   Collection Time: 08/17/17  6:42 PM  Result Value Ref Range   Tricyclic, Ur Screen NONE DETECTED NONE DETECTED   Amphetamines, Ur Screen NONE DETECTED NONE DETECTED   MDMA (Ecstasy)Ur Screen NONE DETECTED NONE DETECTED   Cocaine Metabolite,Ur [place] NONE DETECTED NONE DETECTED   Opiate, Ur Screen NONE DETECTED NONE DETECTED   Phencyclidine (PCP) Ur S NONE DETECTED NONE DETECTED   Cannabinoid 50 Ng, Ur [place] NONE DETECTED NONE  DETECTED   Barbiturates, Ur Screen (A) NONE DETECTED    Result not available. Reagent lot number recalled by manufacturer.   Benzodiazepine, Ur Scrn NONE DETECTED NONE DETECTED   Methadone Scn, Ur NONE DETECTED NONE DETECTED    Comment: (NOTE) Tricyclics + metabolites, urine    Cutoff 1000 ng/mL Amphetamines + metabolites, urine  Cutoff 1000 ng/mL MDMA (Ecstasy), urine              Cutoff 500 ng/mL Cocaine Metabolite, urine          Cutoff 300 ng/mL Opiate + metabolites, urine        Cutoff 300 ng/mL Phencyclidine (PCP), urine         Cutoff 25 ng/mL Cannabinoid, urine                 Cutoff 50 ng/mL Barbiturates + metabolites, urine  Cutoff 200 ng/mL Benzodiazepine, urine              Cutoff 200 ng/mL Methadone, urine                    Cutoff 300 ng/mL The urine drug screen provides only a preliminary, unconfirmed analytical test result and should not be used for non-medical purposes. Clinical consideration and professional judgment should be applied to any positive drug screen result due to possible interfering substances. A more specific alternate chemical method must be used in order to obtain a confirmed analytical result. Gas chromatography / mass spectrometry (GC/MS) is the preferred confirmat ory method. Performed at [facility], [address] [place], [place] [address]   CBC     Status: None   Collection Time: 08/19/17  6:47 AM  Result Value Ref Range   WBC 10.1 3.6 - 11.0 K/uL   RBC 3.80 3.80 - 5.20 MIL/uL   Hemoglobin 12.5 12.0 - 16.0 g/dL   HCT 60.4 54.0 - 98.1 %   MCV 93.8 80.0 - 100.0 fL   MCH 33.0 26.0 - 34.0 pg   MCHC 35.1 32.0 - 36.0 g/dL   RDW 19.1 47.8 - 29.5 %   Platelets 187 150 - 440 K/uL    Comment: Performed at [facility], [address], [place], [place] [address]    Assessment:   22 y.o. G1P1001 postpartum day # 1 in good condition.  Plan:   1) Acute blood loss anemia - hemodynamically stable and asymptomatic  2) Blood Type --/--/O POS (07/05 1839) /   3) Rubella 4.91 (12/12 1059) / Varicella immune / TDAP status: given 06/20/2017  4) Breast feeding  5) Contraception: DepoProvera  6) Disposition: continue postpartum care. Anticipate discharge tomorrow.  Marcelyn Bruins, CNM 08/19/2017  5:09 PM

## 2017-08-19 NOTE — Lactation Note (Signed)
This note was copied from a baby's chart. Lactation Consultation Note  Patient Name: Rachel Josie SaundersMerissa Avery ZOXWR'UToday's Date: 08/19/2017  Observed breast feeding.  Thera FlakeKensley was sleepy this am, but cluster feeding this after noon.  Encouraged mom to continue to put Kensley to the breast anytime she demonstrated hunger cues.  Lactation name and number still on white board and encouraged to call with questions, concerns or assistance.   Maternal Data    Feeding Feeding Type: Breast Fed  LATCH Score                   Interventions    Lactation Tools Discussed/Used     Consult Status      Louis MeckelWilliams, Mariaeduarda Defranco Kay 08/19/2017, 8:05 PM

## 2017-08-19 NOTE — Anesthesia Postprocedure Evaluation (Signed)
Anesthesia Post Note  Patient: Rachel Richards  Procedure(s) Performed: AN AD HOC LABOR EPIDURAL  Patient location during evaluation: Mother Baby Anesthesia Type: Epidural Level of consciousness: awake and alert Pain management: pain level controlled Vital Signs Assessment: post-procedure vital signs reviewed and stable Respiratory status: spontaneous breathing, nonlabored ventilation and respiratory function stable Cardiovascular status: stable Postop Assessment: no headache, no backache, patient able to bend at knees and able to ambulate Anesthetic complications: no     Last Vitals:  Vitals:   08/19/17 0100 08/19/17 0429  BP: 127/83 111/67  Pulse: 73 78  Resp: 18 16  Temp: 36.6 C 36.6 C  SpO2: 97% 98%    Last Pain:  Vitals:   08/19/17 0745  TempSrc:   PainSc: 0-No pain                 Cleda MccreedyJoseph K Piscitello

## 2017-08-19 NOTE — Plan of Care (Signed)
Vs stable; up ad lib; taking motrin for pain control; breastfeeding and doing well

## 2017-08-20 ENCOUNTER — Ambulatory Visit: Admission: RE | Admit: 2017-08-20 | Payer: Medicaid Other | Source: Ambulatory Visit

## 2017-08-20 NOTE — Progress Notes (Signed)
Pt discharged with infant.  Discharge instructions, prescriptions and follow up appointment given to and reviewed with pt. Pt verbalized understanding. Escorted out by auxillary. 

## 2017-08-20 NOTE — Plan of Care (Signed)
Patient's vital signs stable; tolerating regular diet well; good po fluid intake; voiding; breastfeeding well with good technique observed; no c/o pain verbalized this shift, however, scheduled motrin given po every 6 hours; husband at bedside and attentive; patient plans for discharge today.

## 2017-08-23 ENCOUNTER — Inpatient Hospital Stay: Admit: 2017-08-23 | Payer: Self-pay

## 2017-09-03 ENCOUNTER — Ambulatory Visit: Payer: Medicaid Other | Admitting: Maternal Newborn

## 2017-09-12 ENCOUNTER — Encounter: Payer: Self-pay | Admitting: Maternal Newborn

## 2017-09-12 ENCOUNTER — Ambulatory Visit (INDEPENDENT_AMBULATORY_CARE_PROVIDER_SITE_OTHER): Payer: Medicaid Other | Admitting: Maternal Newborn

## 2017-09-12 DIAGNOSIS — Z30013 Encounter for initial prescription of injectable contraceptive: Secondary | ICD-10-CM

## 2017-09-12 MED ORDER — MEDROXYPROGESTERONE ACETATE 150 MG/ML IM SUSP: 150.0000 mg | INTRAMUSCULAR | 3 refills | Status: DC

## 2017-09-12 NOTE — Progress Notes (Signed)
Postpartum Office Visit   S: Well-appearing, smiling, nursing her baby. She feels well and is getting adequate rest. She is still bleeding a small amount. Breastfeeding is going well. Denies problems and concerns.  O: Blood pressure 110/60, pulse 60, height 5\' 2"  (1.575 m), weight 135 lb (61.2 kg), currently breastfeeding.  EPDS Score: 2  A: G1P1001 at four weeks postpartum, recovering well.  P: Return in two weeks for 6 week postpartum visit.  Depo-Provera for contraception. Rx sent today.  Marcelyn BruinsJacelyn Audreena Sachdeva, CNM 09/12/2017

## 2017-09-28 ENCOUNTER — Ambulatory Visit (INDEPENDENT_AMBULATORY_CARE_PROVIDER_SITE_OTHER): Payer: Medicaid Other | Admitting: Maternal Newborn

## 2017-09-28 ENCOUNTER — Other Ambulatory Visit (HOSPITAL_COMMUNITY)
Admission: RE | Admit: 2017-09-28 | Discharge: 2017-09-28 | Disposition: A | Discharge status: Home / Self Care | Payer: Medicaid Other | Source: Ambulatory Visit | Attending: Maternal Newborn | Admitting: Maternal Newborn

## 2017-09-28 ENCOUNTER — Encounter: Payer: Self-pay | Admitting: Maternal Newborn

## 2017-09-28 DIAGNOSIS — L918 Other hypertrophic disorders of the skin: Secondary | ICD-10-CM | POA: Insufficient documentation

## 2017-09-28 NOTE — Progress Notes (Signed)
Postpartum Visit   History of Present Illness: Patient is a 22 y.o. G1P1001 presenting for a postpartum visit.  Date of delivery: 08/18/2017 Type of delivery: Vaginal delivery - Vacuum or forceps assisted  no Episiotomy: No  Laceration: Yes, first degree Pregnancy or labor problems: Oligohydramnios Any problems since the delivery:  no  Newborn Details:  SINGLETON  Female  Birth weight: 6 lb 11 oz  Maternal Details:  Breast Feeding:  no Post partum depression/anxiety noted:  no Edinburgh Post-Partum Depression Score:  0  Date of last PAP: 03/26/2017, NILM   Review of Systems  Constitutional: Negative.   HENT: Negative.   Eyes: Negative.   Respiratory: Negative for cough, shortness of breath and wheezing.   Cardiovascular: Negative for chest pain and palpitations.  Gastrointestinal: Negative.   Genitourinary: Negative.   Musculoskeletal: Negative.   Skin: Negative.   Neurological: Negative.   Endo/Heme/Allergies: Negative.   Psychiatric/Behavioral: Negative for depression. The patient is not nervous/anxious.   All other systems reviewed and are negative.   Past Medical History:  Past Medical History:  Diagnosis Date  . Chronic thoracic back pain   . Depression   . Marijuana use     Past Surgical History:  Past Surgical History:  Procedure Laterality Date  . HAND SURGERY Right 2016   repair broken hand  . WISDOM TOOTH EXTRACTION  2014   all four    Family History:  Family History  Problem Relation Age of Onset  . Colon cancer Maternal Uncle [id number]    Social History:  Social History   Socioeconomic History  . Marital status: Single    Spouse name: Not on file  . Number of children: Not on file  . Years of education: 4914  . Highest education level: Not on file  Occupational History  . Not on file  Social Needs  . Financial resource strain: Not on file  . Food insecurity:    Worry: Not on file    Inability: Not on file  . Transportation needs:   Medical: Not on file    Non-medical: Not on file  Tobacco Use  . Smoking status: Never Smoker  . Smokeless tobacco: Never Used  Substance and Sexual Activity  . Alcohol use: No    Frequency: Never  . Drug use: No    Comment: +UDS for MJ early in pregnancy  . Sexual activity: Yes    Birth control/protection: None  Lifestyle  . Physical activity:    Days per week: Not on file    Minutes per session: Not on file  . Stress: Not on file  Relationships  . Social connections:    Talks on phone: Not on file    Gets together: Not on file    Attends religious service: Not on file    Active member of club or organization: Not on file    Attends meetings of clubs or organizations: Not on file    Relationship status: Not on file  . Intimate partner violence:    Fear of current or ex partner: Not on file    Emotionally abused: Not on file    Physically abused: Not on file    Forced sexual activity: Not on file  Other Topics Concern  . Not on file  Social History Narrative  . Not on file    Allergies:  No Known Allergies  Medications: Prior to Admission medications   Medication Sig Start Date End Date Taking? Authorizing Provider  medroxyPROGESTERone (DEPO-PROVERA) 150  MG/ML injection Inject 1 mL (150 mg total) into the muscle every 3 (three) months. 09/12/17   Oswaldo ConroySchmid, Jacelyn Y, CNM  Prenat-FeAsp-Meth-FA-DHA w/o A (PRENATE PIXIE) 10-0.6-0.4-200 MG CAPS Take 1 capsule by mouth daily. 06/20/17   Tresea MallGledhill, Jane, CNM    Physical Exam Vitals:  Vitals:   09/28/17 1417  BP: 120/60  Pulse: 73    General: NAD HEENT: normocephalic, anicteric Pulmonary: No increased work of breathing Abdomen: NABS, soft, non-tender, non-distended.  Umbilicus without lesions.  No hepatomegaly, splenomegaly or masses palpable. No evidence of hernia.  Genitourinary:  External: Normal external female genitalia.  Normal urethral meatus, normal  Bartholin's and Skene's glands.    Vagina: Normal vaginal  mucosa, no evidence of prolapse.    Cervix: Grossly normal in appearance, no bleeding  Uterus: Non-enlarged, mobile, normal contour.  No CMT  Adnexa: ovaries non-enlarged, no adnexal masses  Rectal: deferred Extremities: no edema, erythema, or tenderness Neurologic: Grossly intact Psychiatric: mood appropriate, affect full  Assessment: 22 y.o. G1P1001 presenting for 6 week postpartum visit.  Plan: Problem List Items Addressed This Visit      Other   Postpartum care following vaginal delivery - Primary    Other Visit Diagnoses    Cutaneous skin tags       Relevant Orders   Surgical pathology      1) Contraception:  She has a prescription for DepoProvera. Did not bring medication with her today, but will return for a nurse visit to receive the injection.Marland Kitchen.  2)  Pap - ASCCP guidelines and rationale discussed.  Patient opts for every 3 year screening interval.  3) Patient underwent screening for postpartum depression with no concerns noted.  4) At patient's request, removed a cutaneous skin tag on the left perineal area under local anesthesia (2% lidocaine with epinephrine) and sent the tissue to pathology. She tolerated the procedure well, site hemostatic.  5) Follow up 1 year for routine annual exam.  Marcelyn BruinsJacelyn Schmid, CNM 09/28/2017

## 2017-10-01 ENCOUNTER — Ambulatory Visit (INDEPENDENT_AMBULATORY_CARE_PROVIDER_SITE_OTHER): Payer: Medicaid Other

## 2017-10-01 DIAGNOSIS — Z3042 Encounter for surveillance of injectable contraceptive: Secondary | ICD-10-CM

## 2017-10-01 DIAGNOSIS — Z30013 Encounter for initial prescription of injectable contraceptive: Secondary | ICD-10-CM

## 2017-10-01 DIAGNOSIS — Z7251 High risk heterosexual behavior: Secondary | ICD-10-CM

## 2017-10-01 DIAGNOSIS — Z3202 Encounter for pregnancy test, result negative: Secondary | ICD-10-CM

## 2017-10-01 LAB — POCT URINE PREGNANCY: PREG TEST UR: NEGATIVE

## 2017-10-01 MED ORDER — MEDROXYPROGESTERONE ACETATE 150 MG/ML IM SUSP
150.0000 mg | Freq: Once | INTRAMUSCULAR | Status: AC
  Administered 2017-10-01: 150 mg via INTRAMUSCULAR

## 2018-05-07 ENCOUNTER — Ambulatory Visit: Payer: Medicaid Other

## 2018-05-09 ENCOUNTER — Encounter: Payer: Medicaid Other | Admitting: Physical Therapy

## 2018-05-14 ENCOUNTER — Encounter: Payer: Medicaid Other | Admitting: Physical Therapy

## 2018-05-16 ENCOUNTER — Encounter: Payer: Medicaid Other | Admitting: Physical Therapy

## 2018-05-21 ENCOUNTER — Encounter: Payer: Medicaid Other | Admitting: Physical Therapy

## 2018-05-23 ENCOUNTER — Encounter: Payer: Medicaid Other | Admitting: Physical Therapy

## 2018-05-28 ENCOUNTER — Encounter: Payer: Medicaid Other | Admitting: Physical Therapy

## 2018-05-30 ENCOUNTER — Encounter: Payer: Medicaid Other | Admitting: Physical Therapy

## 2019-05-21 ENCOUNTER — Encounter: Payer: Self-pay | Admitting: Physical Therapy

## 2019-05-21 ENCOUNTER — Other Ambulatory Visit: Payer: Self-pay

## 2019-05-21 ENCOUNTER — Ambulatory Visit: Payer: Medicaid Other | Attending: Medical Oncology | Admitting: Physical Therapy

## 2019-05-21 DIAGNOSIS — R278 Other lack of coordination: Secondary | ICD-10-CM | POA: Insufficient documentation

## 2019-05-21 DIAGNOSIS — M62838 Other muscle spasm: Secondary | ICD-10-CM | POA: Diagnosis present

## 2019-05-21 DIAGNOSIS — R102 Pelvic and perineal pain: Secondary | ICD-10-CM | POA: Diagnosis present

## 2019-05-21 NOTE — Therapy (Signed)
[facility] [address] [place], [place], [address] Phone: [phone]   Fax:  [phone]  Physical Therapy Evaluation  Patient Details  Name: Rachel Richards MRN: [id number] Date of Birth: 05/20/1995 Referring Provider (PT): Clent Jacks   Encounter Date: 05/21/2019  PT End of Session - 05/21/19 1302    Visit Number  1    Number of Visits  12    Date for PT Re-Evaluation  08/13/19    Authorization Type  CAID    Authorization - Visit Number  1    Authorization - Number of Visits  4    PT Start Time  1300    PT Stop Time  1355    PT Time Calculation (min)  55 min    Activity Tolerance  Patient tolerated treatment well    Behavior During Therapy  [facility] for tasks assessed/performed       Past Medical History:  Diagnosis Date  . Chronic thoracic back pain   . Depression   . Marijuana use     Past Surgical History:  Procedure Laterality Date  . HAND SURGERY Right 2016   repair broken hand  . WISDOM TOOTH EXTRACTION  2014   all four    There were no vitals filed for this visit.         PELVIC HEALTH PHYSICAL THERAPY EVALUATION  SCREENING Red Flags: None Have you had any night sweats? Unexplained weight loss? Saddle anesthesia? Unexplained changes in bowel or bladder habits?  Precautions: None  SUBJECTIVE  Chief Complaint: Patient reports that when she exercises (jumping jacks 10x) she has leakage and with sustained stress otherwise. Patient notes no real urgency, but does note that standing after voiding triggers UI. Patient notes that she has hip pain B (R>L) not position dependent to her recollection during sexual intercourse.   Pertinent History:  Falls Negative.  Scoliosis Positive for mild.  Pulmonary disease/dysfunction Negative. Surgical history: Negative.   Recent Procedures/Tests/Findings: none  Obstetrical History: G1P1 Deliveries: induced; 45 min pushing Tearing/Episiotomy: < grade  1 Birthing position: back  Gynecological History: Endometriosis: Negative Pain with exam: Yes   Urinary History: Incontinence: Positive. Onset: 2019. Triggers: exercising (jumping jacks), sneezing multiple times in a row, standing after urinating. Amount: Min/Mod. Fluid Intake: 6 (16.9 oz) H20 Nocturia: 1-2x/night Frequency of urination: every 1 hours Pain with urination: Negative Difficulty initiating urination: Negative Frequent UTI: Negative.   Gastrointestinal History: [place] Stool Chart: Patient does not look.  Frequency of BMs: 1x/day Pain with defecation: Negative Straining with defecation: Negative Incontinence: Negative.   Sexual activity/pain: Pain with intercourse: Positive.   Initial penetration: No  Deep thrustingYes   Location of pain: B hip Current pain: 0 /10  Max pain: 8 /10 Least pain: 0 /10 Pain quality: pain quality: stabbing Radiating pain: No   Location of pain: vaginal Current pain:  0 /10  Max pain:  5-6 /10 Least pain:  0 /10 Pain quality: pain quality: dull; discomfort Radiating pain: No   Patient assessment of present state: "it just got weak from child birth and the hips are probably from her just sitting there and pushing her out"  Current activities:  Home workouts (jump rope/jumping jacks/circuits); beauty  Patient Goals:  I want to be able to exercise and not pee myself. I don't want to have pain during intercourse. I want to be flexible again.  Patient perception of overall health: Excellent  OBJECTIVE  Mental Status Patient is oriented  to person, place and time.  Recent memory is intact.  Remote memory is intact.  Attention span and concentration are intact.  Expressive speech is intact.  Patient's fund of knowledge is within normal limits for educational level.  POSTURE/OBSERVATIONS:  Lumbar lordosis: WNL Iliac crest height: L elevated Lumbar lateral shift: negative Pelvic obliquity: negative Leg length  discrepancy: negative  GAIT: Grossly WFL  RANGE OF MOTION:    LEFT RIGHT  Lumbar forward flexion (65):  WNL    Lumbar extension (30): WNL    Lumbar lateral flexion (25):  WNL WNL  Thoracic and Lumbar rotation (30 degrees):    WNL WNL  Hip Flexion (0-125):   WNL WNL  Hip IR (0-45):  WNL WNL  Hip ER (0-45):  WNL WNL  Hip Abduction (0-40):  WNL WNL  Hip extension (0-15):  WNL WNL    SENSATION: Grossly intact to light touch bilateral LEs as determined by testing dermatomes L2-S2 Proprioception and hot/cold testing deferred on this date  STRENGTH: MMT   RLE LLE  Hip Flexion 4 4  Hip Extension 4 5  Hip Abduction  3+ 4  Hip Adduction  3+ 4  Hip ER  4 4  Hip IR  4 4   ABDOMINAL:  Palpation: no TTP Diastasis: 2 finger width, with increased laxity at the umbilicus  SPECIAL TESTS: FABER (SN 81): R: Negative L: Negative FADIR (SN 94): R: Negative L: Positive Stork/March (SP 93): R: Negative L: Negative  PHYSICAL PERFORMANCE MEASURES: STS: WNL   EXTERNAL PELVIC EXAM: deferred 2/2 to time constraints Palpation: Breath coordination: Cued Lengthen: Cued Contraction: Cough:  INTERNAL VAGINAL EXAM: deferred 2/2 to time constraints Introitus Appears:  Skin integrity:  Scar mobility: Strength (PERF):  Symmetry: Palpation: Prolapse:   INTERNAL RECTAL EXAM: deferred 2/2 to time constraints Strength (PERF): Symmetry: Palpation: Prolapse:   OUTCOME MEASURES: FOTO (Urinary 50)   ASSESSMENT Patient is a 24 year old presenting to clinic with chief complaints of SUI, hip pain, and pelvic pain. Upon examination, patient demonstrates deficits in B hip strength (R>L), B hip pain free ROM, PFM strength and coordination, posture, linea alba tensegrity as evidenced by MMT 3+/5 R hip, (+) FADDIR B, elevated L IC, >2 finger width separation of RA mm with increased laxity, 8/10 hip pain, and 6/10 vaginal pain. Patient's responses on FOTO outcome measures (Urinary 50) indicate  moderate functional limitations/disability/distress. Patient's progress may be limited due to time resources; however, patient's motivation is advantageous. Patient was able to achieve basic understanding of PFM functions during today's evaluation and responded positively to educational interventions. Patient will benefit from continued skilled therapeutic intervention to address deficits in B hip strength (R>L), B hip pain free ROM, PFM strength and coordination, posture, linea alba tensegrity in order to increase function and improve overall QOL.  EDUCATION Patient educated on prognosis, POC, and provided with HEP including: diaphragmatic breathing. Patient articulated understanding and returned demonstration. Patient will benefit from further education in order to maximize compliance and understanding for long-term therapeutic gains.  TREATMENT Neuromuscular Re-education: Patient educated on primary functions of the pelvic floor including: posture/balance, sexual pleasure, storage and elimination of waste from the body, abdominal cavity closure, and breath coordination. Seated diaphragmatic breathing with VC and TC for improved PFM coordination and IAP management       Objective measurements completed on examination: See above findings.                   PT Long Term Goals -  05/22/19 0851      PT LONG TERM GOAL #1   Title  Patient will demonstrate independence with HEP in order to maximize therapeutic gains and improve carryover from physical therapy sessions to ADLs in the home and community.    Baseline  IE: not initiated    Time  12    Period  Weeks    Status  New    Target Date  08/13/19      PT LONG TERM GOAL #2   Title  Patient will demonstrate independent and coordinated diaphragmatic breathing in supine with a 1:2 breathing pattern for improved down-regulation of the nervous system and improved management of intra-abdominal pressures in order to increase  function at home and in the community.    Baseline  IE: not demonstrated    Time  12    Period  Weeks    Status  New    Target Date  08/13/19      PT LONG TERM GOAL #3   Title  Patient will decrease worst pain as reported on NPRS by at least 2 points to demonstrate clinically significant reduction in pain in order to restore/improve function and overall QOL.    Baseline  IE: hip 8/10, vaginal 6/10    Time  12    Period  Weeks    Status  New    Target Date  08/13/19      PT LONG TERM GOAL #4   Title  Patient will demonstrate improved function as evidenced by a score of 62 on FOTO measure for full participation in activities at home and in the community.    Baseline  IE: 50    Time  12    Period  Weeks    Status  New    Target Date  08/13/19      PT LONG TERM GOAL #5   Title  Patient will demonstrate improved PFM coordination with increased load/stress as evidenced by ability to perform > 10 jumping jacks without urinary leakage.    Baseline  IE: unable    Time  12    Period  Weeks    Status  New    Target Date  08/13/19             Plan - 05/21/19 1303    Clinical Impression Statement  Patient is a 24 year old presenting to clinic with chief complaints of SUI, hip pain, and pelvic pain. Upon examination, patient demonstrates deficits in B hip strength (R>L), B hip pain free ROM, PFM strength and coordination, posture, linea alba tensegrity as evidenced by MMT 3+/5 R hip, (+) FADDIR B, elevated L IC, >2 finger width separation of RA mm with increased laxity, 8/10 hip pain, and 6/10 vaginal pain. Patient's responses on FOTO outcome measures (Urinary 50) indicate moderate functional limitations/disability/distress. Patient's progress may be limited due to time resources; however, patient's motivation is advantageous. Patient was able to achieve basic understanding of PFM functions during today's evaluation and responded positively to educational interventions. Patient will benefit  from continued skilled therapeutic intervention to address deficits in B hip strength (R>L), B hip pain free ROM, PFM strength and coordination, posture, linea alba tensegrity in order to increase function and improve overall QOL.    Personal Factors and Comorbidities  Age;Education;Sex;Comorbidity 2;Past/Current Experience;Time since onset of injury/illness/exacerbation;Fitness    Comorbidities  chronic back pain, depression    Examination-Activity Limitations  Sit;Bed Mobility;Sleep;Squat;Lift;Bend;Stand;Reach Overhead;Continence;Carry    Examination-Participation Restrictions  Interpersonal Relationship;[employer]  Work;Driving;Cleaning;Laundry    Stability/Clinical Decision Making  Evolving/Moderate complexity    Clinical Decision Making  Moderate    Rehab Potential  Good    PT Frequency  1x / week    PT Duration  12 weeks    PT Treatment/Interventions  Cryotherapy;Moist Heat;Electrical Stimulation;Gait training;Stair training;Therapeutic exercise;Patient/family education;Balance training;Neuromuscular re-education;Therapeutic activities;Scar mobilization;Taping;Splinting;Dry needling;Passive range of motion;Joint Manipulations;Spinal Manipulations;Orthotic Fit/Training;Manual techniques    PT Next Visit Plan  External PFM assessment, IAP basics    PT Home Exercise Plan  diaphragmatic breathing    Consulted and Agree with Plan of Care  Patient       Patient will benefit from skilled therapeutic intervention in order to improve the following deficits and impairments:  Pain, Postural dysfunction, Impaired flexibility, Increased fascial restricitons, Decreased strength, Decreased coordination, Improper body mechanics, Decreased range of motion, Decreased scar mobility, Increased muscle spasms, Decreased mobility, Decreased endurance, Abnormal gait  Visit Diagnosis: Pelvic pain  Other lack of coordination  Other muscle spasm     Problem List Patient Active Problem List   Diagnosis Date Noted   . Postpartum care following vaginal delivery 08/20/2017  . Marijuana use 08/04/2017  . Chronic bilateral thoracic back pain 10/14/2014  . Depression 10/14/2014  . Acne 04/29/2013   Myles Gip PT, DPT [phone] 05/22/2019, 8:55 AM  [place] [address] [place], [place], [address] Phone: [phone]   Fax:  [phone]  Name: Rachel Richards MRN: [id number] Date of Birth: 1995/09/16

## 2019-05-28 ENCOUNTER — Other Ambulatory Visit: Payer: Self-pay

## 2019-05-28 ENCOUNTER — Ambulatory Visit: Payer: Medicaid Other | Admitting: Physical Therapy

## 2019-05-28 ENCOUNTER — Encounter: Payer: Self-pay | Admitting: Physical Therapy

## 2019-05-28 DIAGNOSIS — R278 Other lack of coordination: Secondary | ICD-10-CM

## 2019-05-28 DIAGNOSIS — R102 Pelvic and perineal pain: Secondary | ICD-10-CM | POA: Diagnosis not present

## 2019-05-28 DIAGNOSIS — M62838 Other muscle spasm: Secondary | ICD-10-CM

## 2019-05-28 NOTE — Therapy (Signed)
[place] [facility] [address] [place], [place], [address] Phone: [phone]   Fax:  [phone]  Physical Therapy Treatment  Patient Details  Name: Rachel Richards MRN: [id number] Date of Birth: 1995/11/19 Referring Provider (PT): Clent Jacks   Encounter Date: 05/28/2019  PT End of Session - 05/28/19 1301    Visit Number  2    Number of Visits  12    Date for PT Re-Evaluation  08/13/19    Authorization Type  CAID    Authorization - Visit Number  2    Authorization - Number of Visits  4    PT Start Time  1300    PT Stop Time  1355    PT Time Calculation (min)  55 min    Activity Tolerance  Patient tolerated treatment well    Behavior During Therapy  [facility] for tasks assessed/performed       Past Medical History:  Diagnosis Date  . Chronic thoracic back pain   . Depression   . Marijuana use     Past Surgical History:  Procedure Laterality Date  . HAND SURGERY Right 2016   repair broken hand  . WISDOM TOOTH EXTRACTION  2014   all four    There were no vitals filed for this visit.  Subjective Assessment - 05/28/19 1301    Subjective  Patient notes no significant changes since her last visit. She denies any pain today.    Currently in Pain?  No/denies      TREATMENT  Pre-treatment assessment: EXTERNAL PELVIC EXAM: Patient educated on the purpose of the pelvic exam and articulated understanding; patient consented to the exam verbally. Palpation: no TTP Breath coordination: none Cued Lengthen: unable to coordinate Cued Contraction: able to initiate with PFM, gluteal compensation present Cough: paradoxical coordination  Manual Therapy: B hip, LAD for decreased hip pain and spasm and improved mobility B hip, lateral distraction with belt for decreased hip pain and spasm and improved mobility  Neuromuscular Re-education: Supine hooklying diaphragmatic breathing with VCs and TCs for downregulation of the nervous  system and improved management of IAP Supine figure 4 stretch, B, for improved hip mobility and decreased spasm Cat/Cow with coordinated breath pattern for improved spinal mobility Thread the needle, B, with coordinated breath pattern for improved mobility at thoracolumbar jxn Patient educated extensively on intra-abdominal pressure system and impacts on pressure from, breath patterns, PFM, and core musculature. Patient educated on "the knack" after urinating and with gravity dependent positional changes for decreased UI and improved PFM coordination.  Patient educated throughout session on appropriate technique and form using multi-modal cueing, HEP, and activity modification. Patient articulated understanding and returned demonstration.  Patient Response to interventions: Denies increased hip pain  ASSESSMENT Patient presents to clinic with excellent motivation to participate in therapy. Patient demonstrates deficits in B hip strength (R>L), B hip pain free ROM, PFM strength and coordination, posture, linea alba tensegrity. Patient able to achieve basic understanding of IAP management during today's session and responded positively to active interventions. Patient will benefit from continued skilled therapeutic intervention to address remaining deficits in B hip strength (R>L), B hip pain free ROM, PFM strength and coordination, posture, linea alba tensegrity in order to increase function and improve overall QOL.     PT Long Term Goals - 05/22/19 6962      PT LONG TERM GOAL #1   Title  Patient will demonstrate independence with HEP in order to maximize therapeutic gains and  improve carryover from physical therapy sessions to ADLs in the home and community.    Baseline  IE: not initiated    Time  12    Period  Weeks    Status  New    Target Date  08/13/19      PT LONG TERM GOAL #2   Title  Patient will demonstrate independent and coordinated diaphragmatic breathing in supine with a 1:2  breathing pattern for improved down-regulation of the nervous system and improved management of intra-abdominal pressures in order to increase function at home and in the community.    Baseline  IE: not demonstrated    Time  12    Period  Weeks    Status  New    Target Date  08/13/19      PT LONG TERM GOAL #3   Title  Patient will decrease worst pain as reported on NPRS by at least 2 points to demonstrate clinically significant reduction in pain in order to restore/improve function and overall QOL.    Baseline  IE: hip 8/10, vaginal 6/10    Time  12    Period  Weeks    Status  New    Target Date  08/13/19      PT LONG TERM GOAL #4   Title  Patient will demonstrate improved function as evidenced by a score of 62 on FOTO measure for full participation in activities at home and in the community.    Baseline  IE: 50    Time  12    Period  Weeks    Status  New    Target Date  08/13/19      PT LONG TERM GOAL #5   Title  Patient will demonstrate improved PFM coordination with increased load/stress as evidenced by ability to perform > 10 jumping jacks without urinary leakage.    Baseline  IE: unable    Time  12    Period  Weeks    Status  New    Target Date  08/13/19            Plan - 05/28/19 1301    Clinical Impression Statement  Patient presents to clinic with excellent motivation to participate in therapy. Patient demonstrates deficits in B hip strength (R>L), B hip pain free ROM, PFM strength and coordination, posture, linea alba tensegrity. Patient able to achieve basic understanding of IAP management during today's session and responded positively to active interventions. Patient will benefit from continued skilled therapeutic intervention to address remaining deficits in B hip strength (R>L), B hip pain free ROM, PFM strength and coordination, posture, linea alba tensegrity in order to increase function and improve overall QOL.    Personal Factors and Comorbidities   Age;Education;Sex;Comorbidity 2;Past/Current Experience;Time since onset of injury/illness/exacerbation;Fitness    Comorbidities  chronic back pain, depression    Examination-Activity Limitations  Sit;Bed Mobility;Sleep;Squat;Lift;Bend;Stand;Reach Overhead;Continence;Carry    Examination-Participation Restrictions  Interpersonal Relationship;Yard Work;Driving;Cleaning;Laundry    Stability/Clinical Decision Making  Evolving/Moderate complexity    Rehab Potential  Good    PT Frequency  1x / week    PT Duration  12 weeks    PT Treatment/Interventions  Cryotherapy;Moist Heat;Electrical Stimulation;Gait training;Stair training;Therapeutic exercise;Patient/family education;Balance training;Neuromuscular re-education;Therapeutic activities;Scar mobilization;Taping;Splinting;Dry needling;Passive range of motion;Joint Manipulations;Spinal Manipulations;Orthotic Fit/Training;Manual techniques    PT Next Visit Plan  External PFM assessment, IAP basics    PT Home Exercise Plan  diaphragmatic breathing    Consulted and Agree with Plan of Care  Patient  Patient will benefit from skilled therapeutic intervention in order to improve the following deficits and impairments:  Pain, Postural dysfunction, Impaired flexibility, Increased fascial restricitons, Decreased strength, Decreased coordination, Improper body mechanics, Decreased range of motion, Decreased scar mobility, Increased muscle spasms, Decreased mobility, Decreased endurance, Abnormal gait  Visit Diagnosis: Pelvic pain  Other lack of coordination  Other muscle spasm     Problem List Patient Active Problem List   Diagnosis Date Noted  . Postpartum care following vaginal delivery 08/20/2017  . Marijuana use 08/04/2017  . Chronic bilateral thoracic back pain 10/14/2014  . Depression 10/14/2014  . Acne 04/29/2013   Sheria Lang PT, DPT [phone]  05/28/2019, 3:13 PM   [facility] [address] [place], [place], [address] Phone: [phone]   Fax:  [phone]  Name: Rachel Richards MRN: [id number] Date of Birth: [birth year]

## 2019-06-04 ENCOUNTER — Encounter: Payer: Medicaid Other | Admitting: Physical Therapy

## 2019-06-11 ENCOUNTER — Ambulatory Visit: Payer: Medicaid Other | Admitting: Physical Therapy

## 2019-06-11 ENCOUNTER — Encounter: Payer: Self-pay | Admitting: Physical Therapy

## 2019-06-11 ENCOUNTER — Other Ambulatory Visit: Payer: Self-pay

## 2019-06-11 DIAGNOSIS — R102 Pelvic and perineal pain: Secondary | ICD-10-CM

## 2019-06-11 DIAGNOSIS — M62838 Other muscle spasm: Secondary | ICD-10-CM

## 2019-06-11 DIAGNOSIS — R278 Other lack of coordination: Secondary | ICD-10-CM

## 2019-06-11 NOTE — Therapy (Signed)
[place] [facility] [address] [place], [place], [address] Phone: [phone]   Fax:  [phone]  Physical Therapy Treatment  Patient Details  Name: Rachel Richards MRN: [id number] Date of Birth: 29-Nov-1995 Referring Provider (PT): Nelwyn Salisbury   Encounter Date: 06/11/2019  PT End of Session - 06/11/19 1307    Visit Number  3    Number of Visits  12    Date for PT Re-Evaluation  08/13/19    Authorization Type  CAID    Authorization - Visit Number  3    Authorization - Number of Visits  4    PT Start Time  1300    PT Stop Time  3428    PT Time Calculation (min)  45 min    Activity Tolerance  Patient tolerated treatment well;Other (comment)   Treatment limited 2/2 to tolerance of child   Behavior During Therapy  [facility] for tasks assessed/performed       Past Medical History:  Diagnosis Date  . Chronic thoracic back pain   . Depression   . Marijuana use     Past Surgical History:  Procedure Laterality Date  . HAND SURGERY Right 2016   repair broken hand  . [place] EXTRACTION  2014   all four    There were no vitals filed for this visit.  Subjective Assessment - 06/11/19 1303    Subjective  Patient denies any changes since her last visit. Patient notes that the figure 4 stretch is no longer feeling like much a stretch. Patient has been using the knack (when she remembers) to stand up from the toilet and doesn't note much change in her incontinence.    Currently in Pain?  No/denies      TREATMENT Neuromuscular Re-education: Supine hooklying diaphragmatic breathing with VCs and TCs for downregulation of the nervous system and improved management of IAP Supine figure 4 stretch, B, for improved hip mobility and decreased spasm Supine modified Thomas stretch, B, for improved hip mobility and decreased spasm Standing Controlled articular rotations of hip for improved joint mobility and proprioception, 30 % MVC, B, each  direction Supine hooklying, PFM contractions (endurance 2-3 sec x) with exhalation. VCs and TCs to decrease compensatory patterns and encourage activation of the PFM. Supine hooklying, PFM contractions (fast twitch x5). VCs and TCs to decrease compensatory patterns and encourage activation of the PFM.    Patient educated throughout session on appropriate technique and form using multi-modal cueing, HEP, and activity modification. Patient articulated understanding and returned demonstration.  Patient Response to interventions: Comfortable with HEP  ASSESSMENT Patient presents to clinic with excellent motivation to participate in therapy. Patient demonstrates deficits in B hip strength (R>L), B hip pain free ROM, PFM strength and coordination, posture, linea alba tensegrity. Patient able to perform diaphragmatic breathing and PFM contractions with good form during today's session and responded positively to active interventions. Patient will benefit from continued skilled therapeutic intervention to address remaining deficits in B hip strength (R>L), B hip pain free ROM, PFM strength and coordination, posture, linea alba tensegrity in order to increase function and improve overall QOL.    PT Long Term Goals - 05/22/19 0851      PT LONG TERM GOAL #1   Title  Patient will demonstrate independence with HEP in order to maximize therapeutic gains and improve carryover from physical therapy sessions to ADLs in the home and community.    Baseline  IE: not initiated  Time  12    Period  Weeks    Status  New    Target Date  08/13/19      PT LONG TERM GOAL #2   Title  Patient will demonstrate independent and coordinated diaphragmatic breathing in supine with a 1:2 breathing pattern for improved down-regulation of the nervous system and improved management of intra-abdominal pressures in order to increase function at home and in the community.    Baseline  IE: not demonstrated    Time  12     Period  Weeks    Status  New    Target Date  08/13/19      PT LONG TERM GOAL #3   Title  Patient will decrease worst pain as reported on NPRS by at least 2 points to demonstrate clinically significant reduction in pain in order to restore/improve function and overall QOL.    Baseline  IE: hip 8/10, vaginal 6/10    Time  12    Period  Weeks    Status  New    Target Date  08/13/19      PT LONG TERM GOAL #4   Title  Patient will demonstrate improved function as evidenced by a score of 62 on FOTO measure for full participation in activities at home and in the community.    Baseline  IE: 50    Time  12    Period  Weeks    Status  New    Target Date  08/13/19      PT LONG TERM GOAL #5   Title  Patient will demonstrate improved PFM coordination with increased load/stress as evidenced by ability to perform > 10 jumping jacks without urinary leakage.    Baseline  IE: unable    Time  12    Period  Weeks    Status  New    Target Date  08/13/19            Plan - 06/11/19 1308    Clinical Impression Statement  Patient presents to clinic with excellent motivation to participate in therapy. Patient demonstrates deficits in B hip strength (R>L), B hip pain free ROM, PFM strength and coordination, posture, linea alba tensegrity. Patient able to perform diaphragmatic breathing and PFM contractions with good form during today's session and responded positively to active interventions. Patient will benefit from continued skilled therapeutic intervention to address remaining deficits in B hip strength (R>L), B hip pain free ROM, PFM strength and coordination, posture, linea alba tensegrity in order to increase function and improve overall QOL.    Personal Factors and Comorbidities  Age;Education;Sex;Comorbidity 2;Past/Current Experience;Time since onset of injury/illness/exacerbation;Fitness    Comorbidities  chronic back pain, depression    Examination-Activity Limitations  Sit;Bed  Mobility;Sleep;Squat;Lift;Bend;Stand;Reach Overhead;Continence;Carry    Examination-Participation Restrictions  Interpersonal Relationship;Yard Work;Driving;Cleaning;Laundry    Stability/Clinical Decision Making  Evolving/Moderate complexity    Rehab Potential  Good    PT Frequency  1x / week    PT Duration  12 weeks    PT Treatment/Interventions  Cryotherapy;Moist Heat;Electrical Stimulation;Gait training;Stair training;Therapeutic exercise;Patient/family education;Balance training;Neuromuscular re-education;Therapeutic activities;Scar mobilization;Taping;Splinting;Dry needling;Passive range of motion;Joint Manipulations;Spinal Manipulations;Orthotic Fit/Training;Manual techniques    PT Next Visit Plan  External PFM assessment, IAP basics    PT Home Exercise Plan  diaphragmatic breathing    Consulted and Agree with Plan of Care  Patient       Patient will benefit from skilled therapeutic intervention in order to improve the following deficits and impairments:  Pain, Postural dysfunction, Impaired flexibility, Increased fascial restricitons, Decreased strength, Decreased coordination, Improper body mechanics, Decreased range of motion, Decreased scar mobility, Increased muscle spasms, Decreased mobility, Decreased endurance, Abnormal gait  Visit Diagnosis: Pelvic pain  Other lack of coordination  Other muscle spasm     Problem List Patient Active Problem List   Diagnosis Date Noted  . Postpartum care following vaginal delivery 08/20/2017  . Marijuana use 08/04/2017  . Chronic bilateral thoracic back pain 10/14/2014  . Depression 10/14/2014  . Acne 04/29/2013   Sheria Lang PT, DPT [phone]  06/11/2019, 1:49 PM  [place] [facility] [address] [place], [place], [address] Phone: [phone]   Fax:  [phone]  Name: Rachel Richards MRN: [id number] Date of Birth: 1995-03-15

## 2019-06-18 ENCOUNTER — Other Ambulatory Visit: Payer: Self-pay

## 2019-06-18 ENCOUNTER — Encounter: Payer: Self-pay | Admitting: Physical Therapy

## 2019-06-18 ENCOUNTER — Ambulatory Visit: Payer: Medicaid Other | Attending: Medical Oncology | Admitting: Physical Therapy

## 2019-06-18 DIAGNOSIS — R102 Pelvic and perineal pain: Secondary | ICD-10-CM | POA: Diagnosis present

## 2019-06-18 DIAGNOSIS — M62838 Other muscle spasm: Secondary | ICD-10-CM

## 2019-06-18 DIAGNOSIS — R278 Other lack of coordination: Secondary | ICD-10-CM | POA: Diagnosis present

## 2019-06-18 NOTE — Therapy (Signed)
[facility] [address] [place], [place], [address] Phone: [phone]   Fax:  [phone]  Physical Therapy Treatment  Patient Details  Name: Rachel Richards MRN: [id number] Date of Birth: [birth year] Referring Provider (PT): Clent Jacks   Encounter Date: 06/18/2019  PT End of Session - 06/18/19 1308    Visit Number  4    Number of Visits  12    Date for PT Re-Evaluation  08/13/19    Authorization Type  CAID    Authorization - Visit Number  4    Authorization - Number of Visits  16    PT Start Time  1302    PT Stop Time  1355    PT Time Calculation (min)  53 min    Activity Tolerance  Patient tolerated treatment well    Behavior During Therapy  [facility] for tasks assessed/performed       Past Medical History:  Diagnosis Date  . Chronic thoracic back pain   . Depression   . Marijuana use     Past Surgical History:  Procedure Laterality Date  . HAND SURGERY Right 2016   repair broken hand  . [place] EXTRACTION  2014   all four    There were no vitals filed for this visit.  Subjective Assessment - 06/18/19 1305    Subjective  Patient reports nothing new. She does note that she is feeling a stretch when performing her figure 4 stretch. Patient notes that she was able to do jumping jacks last night without UI. Patient notes she continues to have UI with standing after toileting; she notes that the "knack" works for the initial stand but she feels soem minimal leakage with walking after toileting.    Currently in Pain?  No/denies       TREATMENT Neuromuscular Re-education: Supine hooklying diaphragmatic breathing with VCs and TCs for downregulation of the nervous system and improved management of IAP Standing Controlled articular rotations of hip for improved joint mobility and proprioception, 30 % MVC, B, each direction Bridge with GTB for improved pelvic control and postural strength, x15 Bridge with GTB  abduction pulse x3 x15 for improved pelvic control and postural strength Bridge with GTB PFM contraction and TrA for for improved pelvic control and postural strength Clamshell, BLE, GTB, VCs and TCs for active TrA for improved pelvic control and postural strength PAILs/RAILs B hip IR for improved end range hip control and pain modulation Patient education on muscle crmaping with neurological confusion associated with end range functional strength training.   Patient educated throughout session on appropriate technique and form using multi-modal cueing, HEP, and activity modification. Patient articulated understanding and returned demonstration.  Patient Response to interventions: Comfortable with HEP  ASSESSMENT Patient presents to clinic with excellent motivation to participate in therapy. Patient demonstrates deficits in B hip strength (R>L), B hip pain free ROM, PFM strength and coordination, posture, linea alba tensegrity. Patient able to perform all active interventions with acceptable form during today's session and responded positively to tactile cueing with B hip controlled articular rotations. Patient will benefit from continued skilled therapeutic intervention to address remaining deficits in B hip strength (R>L), B hip pain free ROM, PFM strength and coordination, posture, linea alba tensegrity in order to increase function and improve overall QOL.     PT Long Term Goals - 05/22/19 3500      PT LONG TERM GOAL #1   Title  Patient will demonstrate independence with  HEP in order to maximize therapeutic gains and improve carryover from physical therapy sessions to ADLs in the home and community.    Baseline  IE: not initiated    Time  12    Period  Weeks    Status  New    Target Date  08/13/19      PT LONG TERM GOAL #2   Title  Patient will demonstrate independent and coordinated diaphragmatic breathing in supine with a 1:2 breathing pattern for improved down-regulation of the  nervous system and improved management of intra-abdominal pressures in order to increase function at home and in the community.    Baseline  IE: not demonstrated    Time  12    Period  Weeks    Status  New    Target Date  08/13/19      PT LONG TERM GOAL #3   Title  Patient will decrease worst pain as reported on NPRS by at least 2 points to demonstrate clinically significant reduction in pain in order to restore/improve function and overall QOL.    Baseline  IE: hip 8/10, vaginal 6/10    Time  12    Period  Weeks    Status  New    Target Date  08/13/19      PT LONG TERM GOAL #4   Title  Patient will demonstrate improved function as evidenced by a score of 62 on FOTO measure for full participation in activities at home and in the community.    Baseline  IE: 50    Time  12    Period  Weeks    Status  New    Target Date  08/13/19      PT LONG TERM GOAL #5   Title  Patient will demonstrate improved PFM coordination with increased load/stress as evidenced by ability to perform > 10 jumping jacks without urinary leakage.    Baseline  IE: unable    Time  12    Period  Weeks    Status  New    Target Date  08/13/19            Plan - 06/18/19 1308    Clinical Impression Statement  Patient presents to clinic with excellent motivation to participate in therapy. Patient demonstrates deficits in B hip strength (R>L), B hip pain free ROM, PFM strength and coordination, posture, linea alba tensegrity. Patient able to perform all active interventions with acceptable form during today's session and responded positively to tactile cueing with B hip controlled articular rotations. Patient will benefit from continued skilled therapeutic intervention to address remaining deficits in B hip strength (R>L), B hip pain free ROM, PFM strength and coordination, posture, linea alba tensegrity in order to increase function and improve overall QOL.    Personal Factors and Comorbidities   Age;Education;Sex;Comorbidity 2;Past/Current Experience;Time since onset of injury/illness/exacerbation;Fitness    Comorbidities  chronic back pain, depression    Examination-Activity Limitations  Sit;Bed Mobility;Sleep;Squat;Lift;Bend;Stand;Reach Overhead;Continence;Carry    Examination-Participation Restrictions  Interpersonal Relationship;Yard Work;Driving;Cleaning;Laundry    Stability/Clinical Decision Making  Evolving/Moderate complexity    Rehab Potential  Good    PT Frequency  1x / week    PT Duration  12 weeks    PT Treatment/Interventions  Cryotherapy;Moist Heat;Electrical Stimulation;Gait training;Stair training;Therapeutic exercise;Patient/family education;Balance training;Neuromuscular re-education;Therapeutic activities;Scar mobilization;Taping;Splinting;Dry needling;Passive range of motion;Joint Manipulations;Spinal Manipulations;Orthotic Fit/Training;Manual techniques    PT Next Visit Plan  External PFM assessment, IAP basics    PT Home Exercise Plan  diaphragmatic  breathing    Consulted and Agree with Plan of Care  Patient       Patient will benefit from skilled therapeutic intervention in order to improve the following deficits and impairments:  Pain, Postural dysfunction, Impaired flexibility, Increased fascial restricitons, Decreased strength, Decreased coordination, Improper body mechanics, Decreased range of motion, Decreased scar mobility, Increased muscle spasms, Decreased mobility, Decreased endurance, Abnormal gait  Visit Diagnosis: Pelvic pain  Other lack of coordination  Other muscle spasm     Problem List Patient Active Problem List   Diagnosis Date Noted  . Postpartum care following vaginal delivery 08/20/2017  . Marijuana use 08/04/2017  . Chronic bilateral thoracic back pain 10/14/2014  . Depression 10/14/2014  . Acne 04/29/2013   Myles Gip PT, DPT [phone]  06/18/2019, 2:02 PM  [place] [facility] [address] [place], [place], [address] Phone: [phone]   Fax:  [phone]  Name: Rachel Richards MRN: [id number] Date of Birth: Dec 05, 1995

## 2019-06-25 ENCOUNTER — Encounter: Payer: Medicaid Other | Admitting: Physical Therapy

## 2019-07-02 ENCOUNTER — Encounter: Payer: Self-pay | Admitting: Physical Therapy

## 2019-07-02 ENCOUNTER — Other Ambulatory Visit: Payer: Self-pay

## 2019-07-02 ENCOUNTER — Ambulatory Visit: Payer: Medicaid Other | Admitting: Physical Therapy

## 2019-07-02 DIAGNOSIS — R102 Pelvic and perineal pain: Secondary | ICD-10-CM

## 2019-07-02 DIAGNOSIS — M62838 Other muscle spasm: Secondary | ICD-10-CM

## 2019-07-02 DIAGNOSIS — R278 Other lack of coordination: Secondary | ICD-10-CM

## 2019-07-02 NOTE — Therapy (Signed)
[place] [facility] [address] [place], [place], [address] Phone: [phone]   Fax:  [phone]  Physical Therapy Treatment  Patient Details  Name: Rachel Richards MRN: [id number] Date of Birth: Mar 08, 1995 Referring Provider (PT): Nelwyn Salisbury   Encounter Date: 07/02/2019  PT End of Session - 07/02/19 1455    Visit Number  5    Number of Visits  12    Date for PT Re-Evaluation  08/13/19    Authorization Type  CAID    Authorization - Visit Number  5    Authorization - Number of Visits  16    PT Start Time  5809    PT Stop Time  1448    PT Time Calculation (min)  53 min    Activity Tolerance  Patient tolerated treatment well    Behavior During Therapy  [facility] for tasks assessed/performed       Past Medical History:  Diagnosis Date  . Chronic thoracic back pain   . Depression   . Marijuana use     Past Surgical History:  Procedure Laterality Date  . HAND SURGERY Right 2016   repair broken hand  . [place] EXTRACTION  2014   all four    There were no vitals filed for this visit.  Subjective Assessment - 07/02/19 1401    Subjective  Patient states that she has been doing her hip strengthening and the knack for standing after urination. Patient adds that she feels things are "kinda the same" as when she first started coming. Patient does note that her hips are feeling better. She adds that she hasn't been feeling as sore and has been doing her stretches before intercourse and has had no sharp pain.    Currently in Pain?  No/denies       TREATMENT Neuromuscular Re-education: PAILs/RAILs B hip IR/ER for improved end range hip control and pain modulation:  90/90 position  Butterfly position  Sleeper stretch position  Frogger position Patient education on strategies for goal setting to meet desired outcomes, graded re-entry to fitness/gym routine, and abdominal rehabilitation progression.   Patient educated throughout  session on appropriate technique and form using multi-modal cueing, HEP, and activity modification. Patient articulated understanding and returned demonstration.  Patient Response to interventions: Comfortable with return in ~ 1 month.  ASSESSMENT Patient presents to clinic with excellent motivation to participate in therapy. Patient demonstrates deficits in B hip strength (R>L), B hip pain free ROM, PFM strength and coordination, posture, linea alba tensegrity. Patient able to perform hip capsule stretches with good form and understanding during today's session and responded positively to educational interventions. Patient will benefit from continued skilled therapeutic intervention to address remaining deficits in B hip strength (R>L), B hip pain free ROM, PFM strength and coordination, posture, linea alba tensegrity in order to increase function and improve overall QOL.    PT Long Term Goals - 05/22/19 0851      PT LONG TERM GOAL #1   Title  Patient will demonstrate independence with HEP in order to maximize therapeutic gains and improve carryover from physical therapy sessions to ADLs in the home and community.    Baseline  IE: not initiated    Time  12    Period  Weeks    Status  New    Target Date  08/13/19      PT LONG TERM GOAL #2   Title  Patient will demonstrate independent and coordinated diaphragmatic  breathing in supine with a 1:2 breathing pattern for improved down-regulation of the nervous system and improved management of intra-abdominal pressures in order to increase function at home and in the community.    Baseline  IE: not demonstrated    Time  12    Period  Weeks    Status  New    Target Date  08/13/19      PT LONG TERM GOAL #3   Title  Patient will decrease worst pain as reported on NPRS by at least 2 points to demonstrate clinically significant reduction in pain in order to restore/improve function and overall QOL.    Baseline  IE: hip 8/10, vaginal 6/10    Time   12    Period  Weeks    Status  New    Target Date  08/13/19      PT LONG TERM GOAL #4   Title  Patient will demonstrate improved function as evidenced by a score of 62 on FOTO measure for full participation in activities at home and in the community.    Baseline  IE: 50    Time  12    Period  Weeks    Status  New    Target Date  08/13/19      PT LONG TERM GOAL #5   Title  Patient will demonstrate improved PFM coordination with increased load/stress as evidenced by ability to perform > 10 jumping jacks without urinary leakage.    Baseline  IE: unable    Time  12    Period  Weeks    Status  New    Target Date  08/13/19            Plan - 07/02/19 1455    Clinical Impression Statement  Patient presents to clinic with excellent motivation to participate in therapy. Patient demonstrates deficits in B hip strength (R>L), B hip pain free ROM, PFM strength and coordination, posture, linea alba tensegrity. Patient able to perform hip capsule stretches with good form and understanding during today's session and responded positively to educational interventions. Patient will benefit from continued skilled therapeutic intervention to address remaining deficits in B hip strength (R>L), B hip pain free ROM, PFM strength and coordination, posture, linea alba tensegrity in order to increase function and improve overall QOL.    Personal Factors and Comorbidities  Age;Education;Sex;Comorbidity 2;Past/Current Experience;Time since onset of injury/illness/exacerbation;Fitness    Comorbidities  chronic back pain, depression    Examination-Activity Limitations  Sit;Bed Mobility;Sleep;Squat;Lift;Bend;Stand;Reach Overhead;Continence;Carry    Examination-Participation Restrictions  Interpersonal Relationship;Yard Work;Driving;Cleaning;Laundry    Stability/Clinical Decision Making  Evolving/Moderate complexity    Rehab Potential  Good    PT Frequency  1x / week    PT Duration  12 weeks    PT  Treatment/Interventions  Cryotherapy;Moist Heat;Electrical Stimulation;Gait training;Stair training;Therapeutic exercise;Patient/family education;Balance training;Neuromuscular re-education;Therapeutic activities;Scar mobilization;Taping;Splinting;Dry needling;Passive range of motion;Joint Manipulations;Spinal Manipulations;Orthotic Fit/Training;Manual techniques    PT Next Visit Plan  External PFM assessment, IAP basics    PT Home Exercise Plan  diaphragmatic breathing    Consulted and Agree with Plan of Care  Patient       Patient will benefit from skilled therapeutic intervention in order to improve the following deficits and impairments:  Pain, Postural dysfunction, Impaired flexibility, Increased fascial restricitons, Decreased strength, Decreased coordination, Improper body mechanics, Decreased range of motion, Decreased scar mobility, Increased muscle spasms, Decreased mobility, Decreased endurance, Abnormal gait  Visit Diagnosis: Pelvic pain  Other lack of coordination  Other  muscle spasm     Problem List Patient Active Problem List   Diagnosis Date Noted  . Postpartum care following vaginal delivery 08/20/2017  . Marijuana use 08/04/2017  . Chronic bilateral thoracic back pain 10/14/2014  . Depression 10/14/2014  . Acne 04/29/2013   Sheria Lang PT, DPT [phone] 07/02/2019, 3:07 PM  [facility] [address] [place], [place], [address] Phone: [phone]   Fax:  [phone]  Name: DESARAY MARSCHNER MRN: [id number] Date of Birth: 01/26/1996

## 2019-07-09 ENCOUNTER — Encounter: Payer: Medicaid Other | Admitting: Physical Therapy

## 2019-07-16 ENCOUNTER — Encounter: Payer: Medicaid Other | Admitting: Physical Therapy

## 2019-07-23 ENCOUNTER — Encounter: Payer: Medicaid Other | Admitting: Physical Therapy

## 2019-07-30 ENCOUNTER — Ambulatory Visit: Payer: Medicaid Other | Attending: Medical Oncology | Admitting: Physical Therapy

## 2019-08-06 ENCOUNTER — Encounter: Payer: Medicaid Other | Admitting: Physical Therapy

## 2019-08-13 ENCOUNTER — Encounter: Payer: Medicaid Other | Admitting: Physical Therapy

## 2019-09-22 ENCOUNTER — Other Ambulatory Visit: Payer: Medicaid Other

## 2019-10-07 ENCOUNTER — Encounter: Payer: Self-pay | Admitting: Emergency Medicine

## 2019-10-07 ENCOUNTER — Ambulatory Visit
Admission: EM | Admit: 2019-10-07 | Discharge: 2019-10-07 | Disposition: A | Discharge status: Home / Self Care | Payer: Medicaid Other | Attending: Family Medicine | Admitting: Family Medicine

## 2019-10-07 ENCOUNTER — Other Ambulatory Visit: Payer: Self-pay

## 2019-10-07 DIAGNOSIS — R238 Other skin changes: Secondary | ICD-10-CM | POA: Diagnosis present

## 2019-10-07 MED ORDER — PREDNISONE 10 MG PO TABS: ORAL_TABLET | ORAL | 0 refills | Status: DC

## 2019-10-07 MED ORDER — VALACYCLOVIR HCL 1 G PO TABS: 1000.0000 mg | ORAL_TABLET | Freq: Two times a day (BID) | ORAL | 0 refills | Status: AC

## 2019-10-07 NOTE — ED Provider Notes (Signed)
[facility]    CSN: [id number] Arrival date & time: 10/07/19  7989      History   Chief Complaint Rash  HPI  24 year old female presents with rash.  Patient reports that her boyfriend has poison ivy.  Patient reports that she has noticed a rash in her vaginal area as of yesterday.  She is concerned that this is poison ivy.  She states that the area is slightly painful and burns.  No other areas affected.  No medications or interventions tried.  No other associated symptoms.  No other complaints.  Past Medical History:  Diagnosis Date  . Chronic thoracic back pain   . Depression   . Marijuana use     Patient Active Problem List   Diagnosis Date Noted  . Postpartum care following vaginal delivery 08/20/2017  . Marijuana use 08/04/2017  . Chronic bilateral thoracic back pain 10/14/2014  . Depression 10/14/2014  . Acne 04/29/2013    Past Surgical History:  Procedure Laterality Date  . HAND SURGERY Right 2016   repair broken hand  . WISDOM TOOTH EXTRACTION  2014   all four    OB History    Gravida  1   Para  1   Term  1   Preterm      AB      Living  1     SAB      TAB      Ectopic      Multiple  0   Live Births  1            Home Medications    Prior to Admission medications   Medication Sig Start Date End Date Taking? Authorizing Provider  medroxyPROGESTERone (DEPO-PROVERA) 150 MG/ML injection Inject 1 mL (150 mg total) into the muscle every 3 (three) months. 09/12/17  Yes Oswaldo Conroy, CNM  predniSONE (DELTASONE) 10 MG tablet 50 mg daily x 2 days, then 40 mg daily x 2 days, then 30 mg daily x 2 days, then 20 mg daily x 2 days, then 10 mg daily x 2 days. 10/07/19   Tommie Sams, DO  valACYclovir (VALTREX) 1000 MG tablet Take 1 tablet (1,000 mg total) by mouth 2 (two) times daily for 10 days. 10/07/19 10/17/19  Tommie Sams, DO    Family History Family History  Problem Relation Age of Onset  . Colon cancer Maternal Uncle  28    Social History Social History   Tobacco Use  . Smoking status: Never Smoker  . Smokeless tobacco: Never Used  Vaping Use  . Vaping Use: Never used  Substance Use Topics  . Alcohol use: Yes  . Drug use: No    Comment: +UDS for MJ early in pregnancy     Allergies   Patient has no known allergies.   Review of Systems Review of Systems  Constitutional: Negative.   Skin: Positive for rash.   Physical Exam Triage Vital Signs ED Triage Vitals  Enc Vitals Group     BP 10/07/19 0934 129/83     Pulse Rate 10/07/19 0934 94     Resp 10/07/19 0934 18     Temp 10/07/19 0934 98.3 F (36.8 C)     Temp Source 10/07/19 0934 Oral     SpO2 10/07/19 0934 99 %     Weight 10/07/19 0932 140 lb (63.5 kg)     Height 10/07/19 0932 5\' 2"  (1.575 m)     Head Circumference --  Peak Flow --      Pain Score 10/07/19 0931 0     Pain Loc --      Pain Edu? --      Excl. in GC? --    Updated Vital Signs BP 129/83 (BP Location: Right Arm)   Pulse 94   Temp 98.3 F (36.8 C) (Oral)   Resp 18   Ht 5\' 2"  (1.575 m)   Wt 63.5 kg   SpO2 99%   BMI 25.61 kg/m   Visual Acuity Right Eye Distance:   Left Eye Distance:   Bilateral Distance:    Right Eye Near:   Left Eye Near:    Bilateral Near:     Physical Exam Vitals and nursing note reviewed.  Constitutional:      General: She is not in acute distress.    Appearance: Normal appearance. She is not ill-appearing.  HENT:     Head: Normocephalic and atraumatic.  Eyes:     General:        Right eye: No discharge.        Left eye: No discharge.     Conjunctiva/sclera: Conjunctivae normal.  Pulmonary:     Effort: Pulmonary effort is normal. No respiratory distress.  Genitourinary:      Comments: Vesicular rash noted at the labelled location. Neurological:     Mental Status: She is alert.  Psychiatric:        Mood and Affect: Mood normal.        Behavior: Behavior normal.    UC Treatments / Results  Labs (all labs  ordered are listed, but only abnormal results are displayed) Labs Reviewed  HSV CULTURE AND TYPING    EKG   Radiology No results found.  Procedures Procedures (including critical care time)  Medications Ordered in UC Medications - No data to display  Initial Impression / Assessment and Plan / UC Course  I have reviewed the triage vital signs and the nursing notes.  Pertinent labs & imaging results that were available during my care of the patient were reviewed by me and considered in my medical decision making (see chart for details).    24 year old female presents with a vesicular rash in the genital region.  We discussed that that it is possible that this is related to poison ivy although less likely.  Favor genital herpes.  HSV swab is pending.  Placing empirically on prednisone and Valtrex.  Final Clinical Impressions(s) / UC Diagnoses   Final diagnoses:  Vesicular rash     Discharge Instructions     Medication as prescribed.  Results will be back in about 48 hours.  Take care  Dr. 22    ED Prescriptions    Medication Sig Dispense Auth. Provider   predniSONE (DELTASONE) 10 MG tablet 50 mg daily x 2 days, then 40 mg daily x 2 days, then 30 mg daily x 2 days, then 20 mg daily x 2 days, then 10 mg daily x 2 days. 30 tablet Shivaan Tierno G, DO   valACYclovir (VALTREX) 1000 MG tablet Take 1 tablet (1,000 mg total) by mouth 2 (two) times daily for 10 days. 20 tablet 04-14-1983, DO     PDMP not reviewed this encounter.   Tommie Sams, DO 10/07/19 1024

## 2019-10-07 NOTE — ED Triage Notes (Signed)
Patient states she has poison ive on her vaginal area that started yesterday. She states her boyfriend has poison ivy and believes this is how she got it.

## 2019-10-07 NOTE — Discharge Instructions (Signed)
Medication as prescribed.  Results will be back in about 48 hours.  Take care  Dr. Adriana Simas

## 2019-10-09 LAB — HSV CULTURE AND TYPING

## 2019-11-26 DIAGNOSIS — B009 Herpesviral infection, unspecified: Secondary | ICD-10-CM | POA: Insufficient documentation

## 2019-12-31 ENCOUNTER — Ambulatory Visit: Payer: Medicaid Other | Admitting: Dermatology

## 2019-12-31 ENCOUNTER — Ambulatory Visit (INDEPENDENT_AMBULATORY_CARE_PROVIDER_SITE_OTHER): Payer: Medicaid Other | Admitting: Dermatology

## 2019-12-31 ENCOUNTER — Other Ambulatory Visit: Payer: Self-pay

## 2019-12-31 DIAGNOSIS — L710 Perioral dermatitis: Secondary | ICD-10-CM | POA: Diagnosis not present

## 2019-12-31 DIAGNOSIS — L719 Rosacea, unspecified: Secondary | ICD-10-CM

## 2019-12-31 MED ORDER — RETIN-A 0.025 % EX CREA
TOPICAL_CREAM | Freq: Every day | CUTANEOUS | 0 refills | Status: DC
Start: 2019-12-31 — End: 2020-10-13

## 2019-12-31 MED ORDER — DOXYCYCLINE HYCLATE 20 MG PO TABS: 20.0000 mg | ORAL_TABLET | Freq: Two times a day (BID) | ORAL | 0 refills | Status: DC

## 2019-12-31 NOTE — Progress Notes (Signed)
   New Patient Visit  Subjective  Rachel Richards is a 24 y.o. female who presents for the following: Acne (mainly on face. pt not treating with anything currently. Was previously being seen at [facility]).  She notes trouble with spots on her face for years and they are bothersome.  Objective  Well appearing patient in no apparent distress; mood and affect are within normal limits.  A focused examination was performed including face, neck, chest, back. Relevant physical exam findings are noted in the Assessment and Plan.  Objective  Head - Anterior (Face): Scattered inflammatory papules periorificial  Objective  face: Mid face erythema with telangiectasias with scattered inflammatory papules.   Assessment & Plan  Perioral dermatitis Head - Anterior (Face)  Start Doxycycline 20 mg BID. Advised this typically needs to be taken for 2 to 3 months and tapered slowly to prevent recurrence.  Doxycycline should be taken with food to prevent nausea. Do not lay down for 30 minutes after taking. Be cautious with sun exposure and use good sun protection while on this medication. Pregnant women should not take this medication.    Will plan to request records from [facility]  doxycycline (PERIOSTAT) 20 MG tablet - Head - Anterior (Face)  Rosacea face  Chronic, flared.  Rosacea is a chronic progressive skin condition usually affecting the face of adults. It is treatable but not curable. It sometimes affects the eyes (ocular rosacea) as well. It may respond to topical and/or systemic medication and can flare with stress, sun exposure, alcohol, exercise and some foods.  Start tretinoin. Use a pea sized amount spread thin all across face every other night.   Topical retinoid medications like tretinoin/Retin-A, adapalene/Differin, tazarotene/Fabior, and Epiduo/Epiduo Forte can cause dryness and irritation when first started. Only apply a pea-sized amount to the entire  affected area. Avoid applying it around the eyes, edges of mouth and creases at the nose. If you experience irritation, use a good moisturizer first and/or apply the medicine less often. If you are doing well with the medicine, you can increase how often you use it until you are applying every night. Be careful with sun protection while using this medication as it can make you sensitive to the sun. This medicine should not be used by pregnant women.   May consider topical therapy with metronidazole or topical ivermectin if not responding well to tretinoin. For now, doxycycline will call him down inflammation.  RETIN-A 0.025 % cream - face  Return in about 2 months (around 03/01/2020) for recheck acne.   I, Epifania Gore, CMA, am acting as scribe for Darden Dates, MD.  Documentation: I have reviewed the above documentation for accuracy and completeness, and I agree with the above.  Darden Dates, MD

## 2020-01-05 ENCOUNTER — Encounter: Payer: Self-pay | Admitting: Dermatology

## 2020-02-11 ENCOUNTER — Emergency Department (INDEPENDENT_AMBULATORY_CARE_PROVIDER_SITE_OTHER)
Admission: EM | Admit: 2020-02-11 | Discharge: 2020-02-11 | Disposition: A | Discharge status: Home / Self Care | Payer: Medicaid Other | Source: Home / Self Care | Attending: Family Medicine | Admitting: Family Medicine

## 2020-02-11 ENCOUNTER — Emergency Department: Admit: 2020-02-11 | Discharge status: Absent without leave | Payer: Self-pay

## 2020-02-11 ENCOUNTER — Other Ambulatory Visit: Payer: Self-pay

## 2020-02-11 DIAGNOSIS — J029 Acute pharyngitis, unspecified: Secondary | ICD-10-CM | POA: Diagnosis not present

## 2020-02-11 LAB — POC SARS CORONAVIRUS 2 AG -  ED: SARS Coronavirus 2 Ag: NEGATIVE

## 2020-02-11 NOTE — ED Triage Notes (Signed)
Patient presents to Urgent Care with complaints of lower back pain, sore throat, congestion, and cough since 12/27. Patient reports taking mucinex otc. Pt is vaccinated for covid but not the flu.

## 2020-02-11 NOTE — ED Provider Notes (Signed)
[facility]    CSN: [id number] Arrival date & time: 02/11/20  1422      History   Chief Complaint No chief complaint on file.   HPI Rachel Richards is a 24 y.o. female.   Complains of sore throat congestion cough for 2 days.  Taking Mucinex.  Has been vaccinated for Covid.  Denies any urinary symptoms with back pain.  He has been exposed to Covid prior to Christmas.  HPI  Past Medical History:  Diagnosis Date  . Chronic thoracic back pain   . Depression   . Marijuana use     Patient Active Problem List   Diagnosis Date Noted  . Postpartum care following vaginal delivery 08/20/2017  . Marijuana use 08/04/2017  . Chronic bilateral thoracic back pain 10/14/2014  . Depression 10/14/2014  . Acne 04/29/2013    Past Surgical History:  Procedure Laterality Date  . HAND SURGERY Right 2016   repair broken hand  . WISDOM TOOTH EXTRACTION  2014   all four    OB History    Gravida  1   Para  1   Term  1   Preterm      AB      Living  1     SAB      IAB      Ectopic      Multiple  0   Live Births  1            Home Medications    Prior to Admission medications   Medication Sig Start Date End Date Taking? Authorizing Provider  tretinoin (RETIN-A) 0.05 % cream TAKE 1(ONE) APPLICATION(S) TOPICAL EVERY NIGHT AT BEDTIME 12/19/18  Yes [provider]  medroxyPROGESTERone (DEPO-PROVERA) 150 MG/ML injection Inject 1 mL (150 mg total) into the muscle every 3 (three) months. 09/12/17   Oswaldo Conroy, CNM  predniSONE (DELTASONE) 10 MG tablet 50 mg daily x 2 days, then 40 mg daily x 2 days, then 30 mg daily x 2 days, then 20 mg daily x 2 days, then 10 mg daily x 2 days. 10/07/19   Everlene Other G, DO  RETIN-A 0.025 % cream Apply topically at bedtime. Apply as directed every other night. 12/31/19   Sandi Mealy, MD    Family History Family History  Problem Relation Age of Onset  . Colon cancer Maternal Uncle 2    Social  History Social History   Tobacco Use  . Smoking status: Never Smoker  . Smokeless tobacco: Never Used  Vaping Use  . Vaping Use: Never used  Substance Use Topics  . Alcohol use: Yes  . Drug use: No    Comment: +UDS for MJ early in pregnancy     Allergies   Patient has no known allergies.   Review of Systems Review of Systems  HENT: Positive for congestion and sinus pain.   Respiratory: Positive for cough.   Musculoskeletal: Positive for back pain.  All other systems reviewed and are negative.    Physical Exam Triage Vital Signs ED Triage Vitals  Enc Vitals Group     BP 02/11/20 1500 122/82     Pulse Rate 02/11/20 1500 80     Resp 02/11/20 1500 15     Temp 02/11/20 1500 98.9 F (37.2 C)     Temp Source 02/11/20 1500 Oral     SpO2 02/11/20 1500 100 %     Weight 02/11/20 1457 150 lb (68 kg)  Height 02/11/20 1457 5\' 2"  (1.575 m)     Head Circumference --      Peak Flow --      Pain Score 02/11/20 1502 2     Pain Loc --      Pain Edu? --      Excl. in GC? --    No data found.  Updated Vital Signs BP 122/82 (BP Location: Left Arm)   Pulse 80   Temp 98.9 F (37.2 C) (Oral)   Resp 15   Ht 5\' 2"  (1.575 m)   Wt 68 kg   LMP  (LMP Unknown) Comment: unknown  SpO2 100%   Breastfeeding No   BMI 27.44 kg/m   Visual Acuity Right Eye Distance:   Left Eye Distance:   Bilateral Distance:    Right Eye Near:   Left Eye Near:    Bilateral Near:     Physical Exam Vitals and nursing note reviewed.  Constitutional:      Appearance: Normal appearance.  HENT:     Head: Normocephalic.     Right Ear: Tympanic membrane normal.     Left Ear: Tympanic membrane normal.     Mouth/Throat:     Mouth: Mucous membranes are dry.     Pharynx: Oropharynx is clear.  Cardiovascular:     Rate and Rhythm: Normal rate and regular rhythm.  Pulmonary:     Effort: Pulmonary effort is normal.     Breath sounds: Normal breath sounds.  Neurological:     General: No focal  deficit present.     Mental Status: She is alert and oriented to person, place, and time.      UC Treatments / Results  Labs (all labs ordered are listed, but only abnormal results are displayed) Labs Reviewed - No data to display  EKG   Radiology No results found.  Procedures Procedures (including critical care time)  Medications Ordered in UC Medications - No data to display  Initial Impression / Assessment and Plan / UC Course  I have reviewed the triage vital signs and the nursing notes.  Pertinent labs & imaging results that were available during my care of the patient were reviewed by me and considered in my medical decision making (see chart for details).     Upper respiratory infection.  Rapid Covid test is negative Final Clinical Impressions(s) / UC Diagnoses   Final diagnoses:  None   Discharge Instructions   None    ED Prescriptions    None     PDMP not reviewed this encounter.   02/13/20, MD 02/11/20 1600

## 2020-03-07 ENCOUNTER — Encounter: Payer: Self-pay | Admitting: Emergency Medicine

## 2020-03-07 ENCOUNTER — Other Ambulatory Visit: Payer: Self-pay

## 2020-03-07 ENCOUNTER — Ambulatory Visit
Admission: EM | Admit: 2020-03-07 | Discharge: 2020-03-07 | Disposition: A | Discharge status: Home / Self Care | Payer: Medicaid Other | Attending: Physician Assistant | Admitting: Physician Assistant

## 2020-03-07 DIAGNOSIS — Z20822 Contact with and (suspected) exposure to covid-19: Secondary | ICD-10-CM | POA: Diagnosis present

## 2020-03-07 DIAGNOSIS — R519 Headache, unspecified: Secondary | ICD-10-CM | POA: Insufficient documentation

## 2020-03-07 LAB — SARS CORONAVIRUS 2 (TAT 6-24 HRS): SARS Coronavirus 2: NEGATIVE

## 2020-03-07 NOTE — ED Provider Notes (Signed)
[facility] [place]   Name: Rachel Richards DOB: 06-07-1995 MRN: [id number] CSN: [id number] PCP: Pcp, No  Arrival date and time:  03/07/20 1322  Chief Complaint:  Covid Exposure, Headache, and Generalized Body Aches   NOTE: Prior to seeing the patient today, I have reviewed the triage nursing documentation and vital signs. Clinical staff has updated patient's PMH/PSHx, current medication list, and drug allergies/intolerances to ensure comprehensive history available to assist in medical decision making.   History:   HPI: Rachel Richards is a 25 y.o. female who presents today with complaints below:  COVID-19 assessment Symptoms: Headaches and body aches Symptom onset: 24 hours Vaccination status: Fully vaccinated Positive exposure: Yes.  Patient was exposed to little sister who received a positive test result on 01/21 Social precautions: Wearing a mask in public. Employment risk: High.  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 Household: Lives with stepdaughter, husband, and young daughter.  Youngest daughter is currently exhibiting symptoms as well Previous COVID-19 test result: None Symptom management: None    Past Medical History:  Diagnosis Date  . Chronic thoracic back pain   . Depression   . Marijuana use     Past Surgical History:  Procedure Laterality Date  . HAND SURGERY Right 2016   repair broken hand  . WISDOM TOOTH EXTRACTION  2014   all four    Family History  Problem Relation Age of Onset  . Colon cancer Maternal Uncle 35  . Other Mother        MVA  . Healthy Father     Social History   Tobacco Use  . Smoking status: Never Smoker  . Smokeless tobacco: Never Used  Vaping Use  . Vaping Use: Never used  Substance Use Topics  . Alcohol use: Yes    Comment: social  . Drug use: Not Currently    Comment: +UDS for MJ early in pregnancy    Patient Active Problem List   Diagnosis Date Noted   . Postpartum care following vaginal delivery 08/20/2017  . Marijuana use 08/04/2017  . Chronic bilateral thoracic back pain 10/14/2014  . Depression 10/14/2014  . Acne 04/29/2013    Home Medications:    No outpatient medications have been marked as taking for the 03/07/20 encounter [facility] Encounter).    Allergies:   Patient has no known allergies.  Review of Systems (ROS): Review of Systems  Constitutional: Positive for fatigue. Negative for fever.  HENT: Negative for sneezing and sore throat.   Respiratory: Negative for cough.   Cardiovascular: Negative for chest pain.  Gastrointestinal: Negative for diarrhea and nausea.  Musculoskeletal: Positive for myalgias.  Neurological: Positive for headaches.     Vital Signs: Today's Vitals   03/07/20 1415 03/07/20 1416 03/07/20 1417  BP: 127/83    Pulse: (!) 104    Resp: 16    Temp: 98.8 F (37.1 C)    TempSrc: Oral    SpO2: 99%    Weight:   150 lb (68 kg)  Height:   5\' 2"  (1.575 m)  PainSc:  6      Physical Exam: Physical Exam Vitals and nursing note reviewed.  Constitutional:      Appearance: Normal appearance.  Cardiovascular:     Rate and Rhythm: Normal rate and regular rhythm.     Pulses: Normal pulses.     Heart sounds: Normal heart sounds.  Pulmonary:     Effort: Pulmonary  effort is normal.     Breath sounds: Normal breath sounds.  Skin:    General: Skin is warm and dry.  Neurological:     General: No focal deficit present.     Mental Status: She is alert and oriented to person, place, and time.  Psychiatric:        Mood and Affect: Mood normal.        Behavior: Behavior normal.      Urgent Care Treatments / Results:   LABS: PLEASE NOTE: all labs that were ordered this encounter are listed, however only abnormal results are displayed. Labs Reviewed  SARS CORONAVIRUS 2 (TAT 6-24 HRS)    EKG: -None  RADIOLOGY: No results found.  PROCEDURES: Procedures  MEDICATIONS RECEIVED THIS  VISIT: Medications - No data to display  PERTINENT CLINICAL COURSE NOTES/UPDATES:   Initial Impression / Assessment and Plan / Urgent Care Course:  Pertinent labs & imaging results that were available during my care of the patient were personally reviewed by me and considered in my medical decision making (see lab/imaging section of note for values and interpretations).  Rachel Richards is a 25 y.o. female who presents to [facility] today with complaints of headaches and bodyaches, diagnosed with the same, and treated as such with the directions below. NP and patient reviewed discharge instructions below during visit. The patient is given a work excuse note.   Patient is well appearing overall in clinic today. She does not appear to be in any acute distress. Presenting symptoms (see HPI) and exam as documented above.   I have reviewed the follow up and strict return precautions for any new or worsening symptoms. Patient is aware of symptoms that would be deemed urgent/emergent, and would thus require further evaluation either here or in the emergency department. At the time of discharge, she verbalized understanding and consent with the discharge plan as it was reviewed with her. All questions were fielded by provider and/or clinic staff prior to patient discharge.    Final Clinical Impressions / Urgent Care Diagnoses:   Final diagnoses:  Intractable headache, unspecified chronicity pattern, unspecified headache type  Exposure to confirmed case of COVID-19    New Prescriptions:  [place] Controlled Substance Registry consulted? Not Applicable  No orders of the defined types were placed in this encounter.     Discharge Instructions     You were seen for headaches and body aches and are being treated for the same.   You were tested for COVID-19. If you know you were exposed to a confirmed case of COVID-19, continue quarantine until your test results are available.  Interact as  little as possible until COVID-19 test results are available.  Continue to keep her social distance of 6 feet from others, wash hands frequently and wear facemask when indoors or when you are unable to social distance in outdoor settings.  If you develop any symptoms, reach out to the urgent care for further instructions.  If your test results are positive, a member of the urgent care team will reach out to you with further instructions. If you have any further questions, please don't hesitate to reach out to the urgent care clinic.  Over the counter medications that will help your symptoms: Flonase (runny nose, congestion), Zyrtec or Xyzal (postnasal drip, sneezing), Tylenol/Ibuprofen (fever and body aches).  Please sign up for MyChart to access your lab results.  Take care, Dr. [name], NP-c     Recommended Follow up Care:  Patient encouraged to follow up with the following provider within the specified time frame, or sooner as dictated by the severity of her symptoms. As always, she was instructed that for any urgent/emergent care needs, she should seek care either here or in the emergency department for more immediate evaluation.   Bailey Mech, DNP, NP-c    Bailey Mech, NP 03/07/20 1459

## 2020-03-07 NOTE — ED Triage Notes (Signed)
Patient in today c/o headache and body aches x 2 days. Patient states she was exposed to covid from her sister. Patient denies fever. Patient has taken OTC Ibuprofen.

## 2020-03-07 NOTE — Discharge Instructions (Signed)
You were seen for headaches and body aches and are being treated for the same.   You were tested for COVID-19. If you know you were exposed to a confirmed case of COVID-19, continue quarantine until your test results are available.  Interact as little as possible until COVID-19 test results are available.  Continue to keep her social distance of 6 feet from others, wash hands frequently and wear facemask when indoors or when you are unable to social distance in outdoor settings.  If you develop any symptoms, reach out to the urgent care for further instructions.  If your test results are positive, a member of the urgent care team will reach out to you with further instructions. If you have any further questions, please don't hesitate to reach out to the urgent care clinic.  Over the counter medications that will help your symptoms: Flonase (runny nose, congestion), Zyrtec or Xyzal (postnasal drip, sneezing), Tylenol/Ibuprofen (fever and body aches).  Please sign up for MyChart to access your lab results.  Take care, Dr. [name], NP-c

## 2020-03-09 ENCOUNTER — Telehealth: Payer: Self-pay

## 2020-03-09 NOTE — Telephone Encounter (Signed)
Pt needed to reschedule upcoming appt and is rescheduled for 06/09/20. She was asking about a refill of the doxycycline. I read the note from last visit and wanted to check with you first.

## 2020-03-09 NOTE — Telephone Encounter (Signed)
Ok to refill doxycycline until follow-up appointment. Please advise patient if her skin is clear for 2 weeks, she can decrease doxycycline to once a day for 2 weeks and then stop.

## 2020-03-11 ENCOUNTER — Ambulatory Visit: Payer: Medicaid Other | Admitting: Dermatology

## 2020-03-11 ENCOUNTER — Telehealth: Payer: Self-pay

## 2020-03-11 ENCOUNTER — Other Ambulatory Visit: Payer: Self-pay

## 2020-03-11 DIAGNOSIS — L710 Perioral dermatitis: Secondary | ICD-10-CM

## 2020-03-11 MED ORDER — DOXYCYCLINE HYCLATE 20 MG PO TABS: 20.0000 mg | ORAL_TABLET | Freq: Two times a day (BID) | ORAL | 1 refills | Status: DC

## 2020-03-11 NOTE — Telephone Encounter (Signed)
Left msg for patient that we sent in refills of doxycycline 20mg . Advised her to take twice daily with food for 2 weeks then decrease to once daily for 2 weeks then discontinue and to call if any questions, JS

## 2020-03-20 ENCOUNTER — Encounter: Payer: Self-pay | Admitting: Dermatology

## 2020-03-20 DIAGNOSIS — H539 Unspecified visual disturbance: Secondary | ICD-10-CM | POA: Insufficient documentation

## 2020-03-22 MED ORDER — DOXYCYCLINE MONOHYDRATE 100 MG PO CAPS: 100.0000 mg | ORAL_CAPSULE | Freq: Two times a day (BID) | ORAL | 1 refills | Status: DC

## 2020-06-09 ENCOUNTER — Ambulatory Visit: Payer: Medicaid Other | Admitting: Dermatology

## 2020-06-09 ENCOUNTER — Other Ambulatory Visit: Payer: Self-pay

## 2020-06-09 ENCOUNTER — Encounter: Payer: Self-pay | Admitting: Dermatology

## 2020-06-09 DIAGNOSIS — L709 Acne, unspecified: Secondary | ICD-10-CM | POA: Diagnosis not present

## 2020-06-09 MED ORDER — CLINDAMYCIN PHOSPHATE 1 % EX SOLN: Freq: Two times a day (BID) | CUTANEOUS | 5 refills | Status: DC

## 2020-06-09 MED ORDER — AZELAIC ACID 15 % EX GEL: 1.0000 "application " | Freq: Two times a day (BID) | CUTANEOUS | 11 refills | Status: DC

## 2020-06-09 NOTE — Progress Notes (Signed)
   Follow-Up Visit   Subjective  Rachel Richards is a 25 y.o. female who presents for the following: perioral derm (Patient here today for follow up on perioral dermatitis. She has been using doxycycline 100 mg and tretinoin 0.05 % cream to help. Patient also reports tretinoin did not seem to help with pores on her nose. ).  Patient states she has not been using tretinoin 0.05 % cream  as much because she could not see difference in acne after using. She also reports doxycycline 100 mg has not been working with breakouts   The following portions of the chart were reviewed this encounter and updated as appropriate:  Tobacco  Allergies  Meds  Problems  Med Hx  Surg Hx  Fam Hx      Objective  Well appearing patient in no apparent distress; mood and affect are within normal limits.  A focused examination was performed including face, neck. Relevant physical exam findings are noted in the Assessment and Plan.  Objective  Head - Anterior (Face): Trace to 1 + open comedones few inflammatory papules at face photos show larger cysts at chin   Assessment & Plan  Acne, unspecified acne type Head - Anterior (Face)  Chronic condition with duration over one year. Condition is bothersome to patient. Currently flared.  Pt recently stopped depo provera injection. Planning pregnancy soon.  D/c doxycycline and tretinoin as not safe in pregnancy  Start azelaic acid gel twice a day Start clindamycin solution twice a day   Recommend [facility] Hypochlorous Spray (found in the wound care section) daily  Recommend glycolic acid 10% cleanser or wipes  If patient has cyst at chin again may consider ILK; Pt to call clinic  If patient has significant flare during pregnancy can prescribe pregnancy safe oral antibiotic if needed  Discussed spironolactone - patient defers treatment due to wanting to get pregnant   Discussed accutane - patient defers treatment for now wanting to get  pregnant  Discussed BBL forever clear treatment   Azelaic Acid 15 % gel - Head - Anterior (Face)  clindamycin (CLEOCIN T) 1 % external solution - Head - Anterior (Face)  Return in about 6 months (around 12/09/2020).  I, Asher Muir, CMA, am acting as scribe for Darden Dates, MD.  Documentation: I have reviewed the above documentation for accuracy and completeness, and I agree with the above.  Darden Dates, MD

## 2020-06-09 NOTE — Patient Instructions (Addendum)
Recommend [facility] Hypochlorous Cleansing Spray (found in the wound care section). The [facility] Hypochlorous Spray can be sprayed on daily and left on. If you are using clindamycin solution or lotion to treat acne, using a hypochlorous product may help lower the risk of antibiotic resistant bacteria.   If you have any questions or concerns for your doctor, please call our main line at [phone] and press option 4 to reach your doctor's medical assistant. If no one answers, please leave a voicemail as directed and we will return your call as soon as possible. Messages left after 4 pm will be answered the following business day.   You may also send [place] a message via MyChart. We typically respond to MyChart messages within 1-2 business days.  For prescription refills, please ask your pharmacy to contact our office. Our fax number is [phone].  If you have an urgent issue when the clinic is closed that cannot wait until the next business day, you can page your doctor at the number below.    Please note that while we do our best to be available for urgent issues outside of office hours, we are not available 24/7.   If you have an urgent issue and are unable to reach [place], you may choose to seek medical care at your doctor's office, retail clinic, urgent care center, or emergency room.  If you have a medical emergency, please immediately call 911 or go to the emergency department.  Pager Numbers  - Dr. Gwen Pounds: [phone]  - Dr. Neale Burly: [phone]  - Dr. Roseanne Reno: [phone]  In the event of inclement weather, please call our main line at [phone] for an update on the status of any delays or closures.  Dermatology Medication Tips: Please keep the boxes that topical medications come in in order to help keep track of the instructions about where and how to use these. Pharmacies typically print the medication instructions only on the boxes and not directly on the medication tubes.    If your medication is too expensive, please contact our office at [phone] option 4 or send [place] a message through MyChart.   We are unable to tell what your co-pay for medications will be in advance as this is different depending on your insurance coverage. However, we may be able to find a substitute medication at lower cost or fill out paperwork to get insurance to cover a needed medication.   If a prior authorization is required to get your medication covered by your insurance company, please allow [place] 1-2 business days to complete this process.  Drug prices often vary depending on where the prescription is filled and some pharmacies may offer cheaper prices.  The website www.goodrx.com contains coupons for medications through different pharmacies. The prices here do not account for what the cost may be with help from insurance (it may be cheaper with your insurance), but the website can give you the price if you did not use any insurance.  - You can print the associated coupon and take it with your prescription to the pharmacy.  - You may also stop by our office during regular business hours and pick up a GoodRx coupon card.  - If you need your prescription sent electronically to a different pharmacy, notify our office through [facility] or by phone at [phone] option 4.

## 2020-07-08 ENCOUNTER — Other Ambulatory Visit: Payer: Self-pay

## 2020-07-08 ENCOUNTER — Ambulatory Visit
Admission: EM | Admit: 2020-07-08 | Discharge: 2020-07-08 | Disposition: A | Discharge status: Home / Self Care | Payer: Medicaid Other | Attending: Family Medicine | Admitting: Family Medicine

## 2020-07-08 ENCOUNTER — Encounter: Payer: Self-pay | Admitting: Emergency Medicine

## 2020-07-08 DIAGNOSIS — M7672 Peroneal tendinitis, left leg: Secondary | ICD-10-CM | POA: Diagnosis not present

## 2020-07-08 MED ORDER — MELOXICAM 15 MG PO TABS: 15.0000 mg | ORAL_TABLET | Freq: Every day | ORAL | 0 refills | Status: DC | PRN

## 2020-07-08 NOTE — ED Provider Notes (Signed)
[facility]    CSN: [id number] Arrival date & time: 07/08/20  0911      History   Chief Complaint Chief Complaint  Patient presents with  . Ankle Pain    left   HPI  25 year old female presents with the above complaint.  Patient reports that she has had left ankle pain for the past 3 weeks.  Patient reports that she recently twisted her ankle but is unsure of how long its been.  She continues to have pain.  She states that she has been doing a lot of walking and running recently.  No relieving factors.  Seems to be exacerbated by activity.  Pain currently 7/10 in severity.  Described as aching.  No other complaints.  Past Medical History:  Diagnosis Date  . Chronic thoracic back pain   . Depression   . Marijuana use     Patient Active Problem List   Diagnosis Date Noted  . Postpartum care following vaginal delivery 08/20/2017  . Marijuana use 08/04/2017  . Chronic bilateral thoracic back pain 10/14/2014  . Depression 10/14/2014  . Acne 04/29/2013    Past Surgical History:  Procedure Laterality Date  . HAND SURGERY Right 2016   repair broken hand  . WISDOM TOOTH EXTRACTION  2014   all four    OB History    Gravida  1   Para  1   Term  1   Preterm      AB      Living  1     SAB      IAB      Ectopic      Multiple  0   Live Births  1            Home Medications    Prior to Admission medications   Medication Sig Start Date End Date Taking? Authorizing Provider  meloxicam (MOBIC) 15 MG tablet Take 1 tablet (15 mg total) by mouth daily as needed for pain. 07/08/20  Yes Jacqulyne Gladue G, DO  RETIN-A 0.025 % cream Apply topically at bedtime. Apply as directed every other night. 12/31/19   [place], [place], MD  tretinoin (RETIN-A) 0.05 % cream TAKE 1(ONE) APPLICATION(S) TOPICAL EVERY NIGHT AT BEDTIME 12/19/18   [provider]  medroxyPROGESTERone (DEPO-PROVERA) 150 MG/ML injection Inject 1 mL (150 mg total) into the muscle every  3 (three) months. 09/12/17 07/08/20  Oswaldo Conroy, CNM    Family History Family History  Problem Relation Age of Onset  . Colon cancer Maternal Uncle 35  . Other Mother        MVA  . Healthy Father     Social History Social History   Tobacco Use  . Smoking status: Never Smoker  . Smokeless tobacco: Never Used  Vaping Use  . Vaping Use: Never used  Substance Use Topics  . Alcohol use: Yes    Comment: social  . Drug use: Not Currently    Comment: +UDS for MJ early in pregnancy     Allergies   Patient has no known allergies.   Review of Systems Review of Systems Per HPI  Physical Exam Triage Vital Signs ED Triage Vitals  Enc Vitals Group     BP 07/08/20 1007 123/81     Pulse Rate 07/08/20 1007 74     Resp 07/08/20 1007 18     Temp 07/08/20 1007 98.2 F (36.8 C)     Temp Source 07/08/20 1007 Oral  SpO2 07/08/20 1007 100 %     Weight 07/08/20 1008 149 lb 14.6 oz (68 kg)     Height 07/08/20 1008 5\' 2"  (1.575 m)     Head Circumference --      Peak Flow --      Pain Score 07/08/20 1008 7     Pain Loc --      Pain Edu? --      Excl. in GC? --    Updated Vital Signs BP 123/81 (BP Location: Left Arm)   Pulse 74   Temp 98.2 F (36.8 C) (Oral)   Resp 18   Ht 5\' 2"  (1.575 m)   Wt 68 kg   LMP 07/05/2020 (Exact Date)   SpO2 100%   BMI 27.42 kg/m   Visual Acuity Right Eye Distance:   Left Eye Distance:   Bilateral Distance:    Right Eye Near:   Left Eye Near:    Bilateral Near:     Physical Exam Vitals and nursing note reviewed.  Constitutional:      General: She is not in acute distress.    Appearance: Normal appearance. She is not ill-appearing.  HENT:     Head: Normocephalic and atraumatic.  Eyes:     General:        Right eye: No discharge.        Left eye: No discharge.     Conjunctiva/sclera: Conjunctivae normal.  Pulmonary:     Effort: Pulmonary effort is normal. No respiratory distress.  Musculoskeletal:     Comments: Left  ankle -patient with tenderness posteriorly to the lateral malleolus and extending proximally.  Achilles intact and nontender.  Neurological:     Mental Status: She is alert.  Psychiatric:        Mood and Affect: Mood normal.        Behavior: Behavior normal.    UC Treatments / Results  Labs (all labs ordered are listed, but only abnormal results are displayed) Labs Reviewed - No data to display  EKG   Radiology No results found.  Procedures Procedures (including critical care time)  Medications Ordered in UC Medications - No data to display  Initial Impression / Assessment and Plan / UC Course  I have reviewed the triage vital signs and the nursing notes.  Pertinent labs & imaging results that were available during my care of the patient were reviewed by me and considered in my medical decision making (see chart for details).    25 year old female presents with peroneal tendinitis.  Advised rest, ice, elevation.  Meloxicam as directed.  Information given regarding orthopedics if she fails improve or worsens.  Supportive care.  Final Clinical Impressions(s) / UC Diagnoses   Final diagnoses:  Peroneal tendinitis of left lower extremity     Discharge Instructions     Rest, ice, elevation  Medication as prescribed.  If persists, Please call [facility] clinic Orthopedics [phone]) OR [facility] [phone]) for an appt.  Take care  Dr. ([phone]    ED Prescriptions    Medication Sig Dispense Auth. Provider   meloxicam (MOBIC) 15 MG tablet Take 1 tablet (15 mg total) by mouth daily as needed for pain. 30 tablet ([phone], DO     PDMP not reviewed this encounter.   Adriana Simas, Tommie Sams 07/08/20 1104

## 2020-07-08 NOTE — Discharge Instructions (Signed)
Rest, ice, elevation  Medication as prescribed.  If persists, Please call [facility] clinic Orthopedics [phone]) OR [facility] [phone]) for an appt.  Take care  Dr. Adriana Simas

## 2020-07-08 NOTE — ED Triage Notes (Signed)
Pt c/o left ankle pain. Pain is in the achilles area and on the outer side of her ankle. She states about 3 weeks ago she hurt the achilles area and about 2 weeks ago she rolled her ankle.

## 2020-09-09 ENCOUNTER — Ambulatory Visit (LOCAL_COMMUNITY_HEALTH_CENTER): Payer: Medicaid Other

## 2020-09-09 ENCOUNTER — Other Ambulatory Visit: Payer: Self-pay

## 2020-09-09 VITALS — BP 113/71 | Ht 62.0 in | Wt 156.0 lb

## 2020-09-09 DIAGNOSIS — Z3201 Encounter for pregnancy test, result positive: Secondary | ICD-10-CM

## 2020-09-09 LAB — PREGNANCY, URINE: Preg Test, Ur: POSITIVE — AB

## 2020-09-09 NOTE — Progress Notes (Signed)
Pt desires prenatal care at [facility]. Sent to preadmit. Has supply of PNV.

## 2020-09-27 ENCOUNTER — Telehealth: Payer: Self-pay

## 2020-09-27 NOTE — Telephone Encounter (Signed)
Pt calling; is 8wks preg; NOB 8/31st; for the past 3wks has been having really bad morning sickness; can something be rx'd?  [phone]  Pt states she is somewhat keeping liquids down; adv can take Emetrol; sip clear liquids; eat something q3h if only a saltine cracker, dry toast, dry baked potatoe, plain white rice; pt aware can take vitamin B6 10-24mg  q8hrs and Unisom 24mg  at HS and 12.5mg  in am; ginger drops, nausea suckers and sea bands.  Pt aware to call if unable to keep liquids down for 24hrs.

## 2020-09-28 ENCOUNTER — Telehealth: Payer: Self-pay

## 2020-09-28 NOTE — Telephone Encounter (Signed)
Pt calling; called yesterday and was told if unable to keep liquids down for 24hrs to call; pt hasn't kept fluids down for 24hrs; hasn't tried solid food.  [phone]  Adv pt to go to L&D as they can give her fluids and we cannot do that in office; is allowed one person with her and to wear a mask.

## 2020-10-13 ENCOUNTER — Ambulatory Visit (INDEPENDENT_AMBULATORY_CARE_PROVIDER_SITE_OTHER): Payer: Medicaid Other | Admitting: Advanced Practice Midwife

## 2020-10-13 ENCOUNTER — Other Ambulatory Visit (HOSPITAL_COMMUNITY)
Admission: RE | Admit: 2020-10-13 | Discharge: 2020-10-13 | Disposition: A | Discharge status: Home / Self Care | Payer: BLUE CROSS/BLUE SHIELD | Source: Ambulatory Visit | Attending: Advanced Practice Midwife | Admitting: Advanced Practice Midwife

## 2020-10-13 ENCOUNTER — Other Ambulatory Visit: Payer: Self-pay

## 2020-10-13 ENCOUNTER — Encounter: Payer: Self-pay | Admitting: Advanced Practice Midwife

## 2020-10-13 VITALS — BP 142/82 | HR 87 | Ht 62.0 in | Wt 156.0 lb

## 2020-10-13 DIAGNOSIS — Z113 Encounter for screening for infections with a predominantly sexual mode of transmission: Secondary | ICD-10-CM | POA: Insufficient documentation

## 2020-10-13 DIAGNOSIS — Z3490 Encounter for supervision of normal pregnancy, unspecified, unspecified trimester: Secondary | ICD-10-CM | POA: Insufficient documentation

## 2020-10-13 DIAGNOSIS — Z124 Encounter for screening for malignant neoplasm of cervix: Secondary | ICD-10-CM | POA: Insufficient documentation

## 2020-10-13 DIAGNOSIS — Z1379 Encounter for other screening for genetic and chromosomal anomalies: Secondary | ICD-10-CM

## 2020-10-13 DIAGNOSIS — Z369 Encounter for antenatal screening, unspecified: Secondary | ICD-10-CM | POA: Insufficient documentation

## 2020-10-13 DIAGNOSIS — Z3481 Encounter for supervision of other normal pregnancy, first trimester: Secondary | ICD-10-CM | POA: Diagnosis present

## 2020-10-13 NOTE — Progress Notes (Signed)
New Obstetric Patient H&P    Chief Complaint: "Desires prenatal care"   History of Present Illness: Patient is a 25 y.o. G2P1001 Not Hispanic or Latino female, presents with amenorrhea and positive home pregnancy test. Patient's last menstrual period was 08/02/2020 (within days). and based on her  LMP, her EDD is Estimated Date of Delivery: 05/09/21 and her EGA is [redacted]w[redacted]d. Cycles are 7 days, regular, and occur approximately every : 28 days. Her last pap smear was 3 years ago and was no abnormalities.    She had a urine pregnancy test which was positive 4 or 5 week(s)  ago. Her last menstrual period was normal and lasted for  7 day(s). Since her LMP she claims she has experienced breast tenderness, fatigue, nausea, vomiting. She was seen at the hospital for N/V and started on Unisom/B6 and Reglan. She denies vaginal bleeding. Her past medical history is noncontributory. Her prior pregnancies are notable for  SVD 6#11oz female in 2019.  Since her LMP, she admits to the use of tobacco products  no She claims she has gained  2  pounds since the start of her pregnancy.  There are cats in the home in the home  no  She admits close contact with children on a regular basis  yes  She has had chicken pox in the past no She has had Tuberculosis exposures, symptoms, or previously tested positive for TB   no Current or past history of domestic violence. no  Genetic Screening/Teratology Counseling: (Includes patient, baby's father, or anyone in either family with:)   1. Patient's age >/= 41 at [facility]  no 2. Thalassemia ([place], [place], [place], or Asian background): MCV<80  no 3. Neural tube defect (meningomyelocele, spina bifida, anencephaly)  no 4. Congenital heart defect  no  5. Down syndrome  no 6. Tay-Sachs (Jewish, [place]  no 7. Canavan's Disease  no 8. Sickle cell disease or trait (African)  no  9. Hemophilia or other blood disorders  no  10. Muscular dystrophy  no  11. Cystic  fibrosis  no  12. Huntington's Chorea  no  13. Mental retardation/autism  no 14. Other inherited genetic or chromosomal disorder  no 15. Maternal metabolic disorder (DM, PKU, etc)  no 16. Patient or FOB with a child with a birth defect not listed above no  16a. Patient or FOB with a birth defect themselves no 17. Recurrent pregnancy loss, or stillbirth  no  18. Any medications since LMP other than prenatal vitamins (include vitamins, supplements, OTC meds, drugs, alcohol)  no 19. Any other genetic/environmental exposure to discuss  no  Infection History:   1. Lives with someone with TB or TB exposed  no  2. Patient or partner has history of genital herpes  yes 3. Rash or viral illness since LMP  no 4. History of STI (GC, CT, HPV, syphilis, HIV)  no 5. History of recent travel :  no  Other pertinent information:  no    Review of Systems:10 point review of systems negative unless otherwise noted in HPI  Past Medical History:  Patient Active Problem List   Diagnosis Date Noted   Vision changes 03/20/2020   Herpes simplex 11/26/2019    Past Surgical History:  Past Surgical History:  Procedure Laterality Date   HAND SURGERY Right 2016   repair broken hand   WISDOM TOOTH EXTRACTION  2014   all four    Gynecologic History: Patient's last menstrual period was 08/02/2020 (within days).  Obstetric History: G2P1001  Family History:  Family History  Problem Relation Age of Onset   Colon cancer Maternal Uncle 48   Other Mother        MVA   Healthy Father     Social History:  Social History   Socioeconomic History   Marital status: Single    Spouse name: Not on file   Number of children: Not on file   Years of education: 14   Highest education level: Not on file  Occupational History   Not on file  Tobacco Use   Smoking status: Never   Smokeless tobacco: Never  Vaping Use   Vaping Use: Never used  Substance and Sexual Activity   Alcohol use: Yes    Comment:  sometimes but not during pregnancy   Drug use: Not Currently    Types: Marijuana    Comment: +UDS for MJ early in pregnancy   Sexual activity: Yes    Birth control/protection: None  Other Topics Concern   Not on file  Social History Narrative   Not on file   Social Determinants of Health   Financial Resource Strain: Not on file  Food Insecurity: Not on file  Transportation Needs: Not on file  Physical Activity: Not on file  Stress: Not on file  Social Connections: Not on file  Intimate Partner Violence: Not At Risk   Fear of Current or Ex-Partner: No   Emotionally Abused: No   Physically Abused: No   Sexually Abused: No    Allergies:  No Known Allergies  Medications: Prior to Admission medications   Medication Sig Start Date End Date Taking? Authorizing Provider  doxylamine, Sleep, (UNISOM) 25 MG tablet Take by mouth. 09/29/20  Yes [provider]  Prenatal Vit-Fe Fumarate-FA (PRENATAL VITAMIN PO) Take 1 tablet by mouth daily.   Yes [provider]  pyridOXINE (VITAMIN B-6) 25 MG tablet Take by mouth. 09/29/20  Yes [provider]  metoCLOPramide (REGLAN) 10 MG tablet Take 10 mg by mouth every 6 (six) hours as needed. 09/29/20   [provider]  medroxyPROGESTERone (DEPO-PROVERA) 150 MG/ML injection Inject 1 mL (150 mg total) into the muscle every 3 (three) months. 09/12/17 07/08/20  Oswaldo Conroy, CNM    Physical Exam Vitals: Blood pressure (!) 142/82, pulse 87, height 5\' 2"  (1.575 m), weight 156 lb (70.8 kg), last menstrual period 08/02/2020.  General: NAD HEENT: normocephalic, anicteric Thyroid: no enlargement, no palpable nodules Pulmonary: No increased work of breathing, CTAB Cardiovascular: RRR, distal pulses 2+ Abdomen: NABS, soft, non-tender, non-distended.  Umbilicus without lesions.  No hepatomegaly, splenomegaly or masses palpable. No evidence of hernia  Genitourinary:  External: Normal external female genitalia.  Normal  urethral meatus, normal  Bartholin's and Skene's glands.    Vagina: Normal vaginal mucosa, no evidence of prolapse.    Cervix: Grossly normal in appearance, no bleeding, no CMT  Uterus: Non-enlarged, mobile, normal contour.    Adnexa: ovaries non-enlarged, no adnexal masses  Rectal: deferred Extremities: no edema, erythema, or tenderness Neurologic: Grossly intact Psychiatric: mood appropriate, affect full   The following were addressed during this visit:  Breastfeeding Education - Early initiation of breastfeeding    Comments: Keeps milk supply adequate, helps contract uterus and slow bleeding, and early milk is the perfect first food and is easy to digest.   - The importance of exclusive breastfeeding    Comments: Provides antibodies, Lower risk of breast and ovarian cancers, and type-2 diabetes,Helps your body recover, Reduced  chance of SIDS.   - Risks of giving your baby anything other than breast milk if you are breastfeeding    Comments: Make the baby less content with breastfeeds, may make my baby more susceptible to illness, and may reduce my milk supply.   - Nonpharmacological pain relief methods for labor    Comments: Deep breathing, focusing on pleasant things, movement and walking, heating pads or cold compress, massage and relaxation, continuous support from someone you trust, and Doulas   - The importance of early skin-to-skin contact    Comments:  Keeps baby warm and secure, helps keep baby's blood sugar up and breathing steady, easier to bond and breastfeed, and helps calm baby.  - Rooming-in on a 24-hour basis    Comments: Easier to learn baby's feeding cues, easier to bond and get to know each other, and encourages milk production.   - Feeding on demand or baby-led feeding    Comments: Helps prevent breastfeeding complications, helps bring in good milk supply, prevents under or overfeeding, and helps baby feel content and satisfied   - Frequent feeding to  help assure optimal milk production    Comments: Making a full supply of milk requires frequent removal of milk from breasts, infant will eat 8-12 times in 24 hours, if separated from infant use breast massage, hand expression and/ or pumping to remove milk from breasts.   - Effective positioning and attachment    Comments: Helps my baby to get enough breast milk, helps to produce an adequate milk supply, and helps prevent nipple pain and damage   - Exclusive breastfeeding for the first 6 months    Comments: Builds a healthy milk supply and keeps it up, protects baby from sickness and disease, and breastmilk has everything your baby needs for the first 6 months.   Assessment: 25 y.o. G2P1001 at [redacted]w[redacted]d presenting to initiate prenatal care  Plan: 1) Avoid alcoholic beverages. 2) Patient encouraged not to smoke.  3) Discontinue the use of all non-medicinal drugs and chemicals.  4) Take prenatal vitamins daily.  5) Nutrition, food safety (fish, cheese advisories, and high nitrite foods) and exercise discussed. 6) Hospital and practice style discussed with cross coverage system.  7) Genetic Screening, such as with 1st Trimester Screening, cell free fetal DNA, AFP testing, and Ultrasound, as well as with amniocentesis and CVS as appropriate, is discussed with patient. At the conclusion of today's visit patient requested genetic testing 8) Patient is asked about travel to areas at risk for the Zika virus, and counseled to avoid travel and exposure to mosquitoes or sexual partners who may have themselves been exposed to the virus. Testing is discussed, and will be ordered as appropriate.  9) PAPtima, urine culture, NOB panel, MaterniT 21 today 10) Return to clinic in 4 weeks for ROB   Tresea Mall, CNM [facility] 10/13/2020, 1:18 PM

## 2020-10-13 NOTE — Patient Instructions (Signed)

## 2020-10-14 LAB — CYTOLOGY - PAP
Chlamydia: NEGATIVE
Comment: NEGATIVE
Comment: NEGATIVE
Comment: NORMAL
Diagnosis: NEGATIVE
Neisseria Gonorrhea: NEGATIVE
Trichomonas: NEGATIVE

## 2020-10-15 LAB — URINE CULTURE

## 2020-10-21 LAB — RPR+RH+ABO+RUB AB+AB SCR+CB...
Antibody Screen: NEGATIVE
HIV Screen 4th Generation wRfx: NONREACTIVE
Hematocrit: 39.7 % (ref 34.0–46.6)
Hemoglobin: 13.4 g/dL (ref 11.1–15.9)
Hepatitis B Surface Ag: NEGATIVE
MCH: 30.5 pg (ref 26.6–33.0)
MCHC: 33.8 g/dL (ref 31.5–35.7)
MCV: 90 fL (ref 79–97)
Platelets: 294 10*3/uL (ref 150–450)
RBC: 4.4 x10E6/uL (ref 3.77–5.28)
RDW: 12.5 % (ref 11.7–15.4)
RPR Ser Ql: NONREACTIVE
Rh Factor: POSITIVE
Rubella Antibodies, IGG: 5.89 index (ref >0.99)
Varicella zoster IgG: 447 index (ref >165)
WBC: 9.8 10*3/uL (ref 3.4–10.8)

## 2020-10-21 LAB — MATERNIT21 PLUS CORE+SCA
Fetal Fraction: 4
Monosomy X (Turner Syndrome): NOT DETECTED
Result (T21): NEGATIVE
Trisomy 13 (Patau syndrome): NEGATIVE
Trisomy 18 (Edwards syndrome): NEGATIVE
Trisomy 21 (Down syndrome): NEGATIVE
XXX (Triple X Syndrome): NOT DETECTED
XXY (Klinefelter Syndrome): NOT DETECTED
XYY (Jacobs Syndrome): NOT DETECTED

## 2020-11-03 ENCOUNTER — Telehealth: Payer: Self-pay
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
## 2020-11-04 ENCOUNTER — Other Ambulatory Visit: Payer: Self-pay

## 2020-11-04 ENCOUNTER — Ambulatory Visit (INDEPENDENT_AMBULATORY_CARE_PROVIDER_SITE_OTHER): Payer: Medicaid Other | Admitting: Obstetrics and Gynecology

## 2020-11-04 VITALS — BP 133/82 | Wt 156.0 lb

## 2020-11-04 DIAGNOSIS — Z3481 Encounter for supervision of other normal pregnancy, first trimester: Secondary | ICD-10-CM

## 2020-11-04 DIAGNOSIS — Z3A13 13 weeks gestation of pregnancy: Secondary | ICD-10-CM

## 2020-11-04 LAB — POCT URINALYSIS DIPSTICK OB
Glucose, UA: NEGATIVE
POC,PROTEIN,UA: NEGATIVE

## 2020-11-04 NOTE — Telephone Encounter (Signed)
Pt called this am and scheduled an appt.
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
## 2020-11-04 NOTE — Progress Notes (Signed)
ROB

## 2020-11-11 ENCOUNTER — Encounter: Payer: Medicaid Other | Admitting: Advanced Practice Midwife

## 2020-12-01 ENCOUNTER — Other Ambulatory Visit: Payer: Self-pay | Admitting: Advanced Practice Midwife

## 2020-12-01 DIAGNOSIS — O219 Vomiting of pregnancy, unspecified: Secondary | ICD-10-CM

## 2020-12-01 MED ORDER — ONDANSETRON 4 MG PO TBDP: 4.0000 mg | ORAL_TABLET | Freq: Four times a day (QID) | ORAL | 2 refills | Status: DC | PRN

## 2020-12-01 NOTE — Progress Notes (Signed)
Rx zofran sent

## 2020-12-02 ENCOUNTER — Ambulatory Visit: Payer: Medicaid Other | Admitting: Dermatology

## 2020-12-09 ENCOUNTER — Ambulatory Visit (INDEPENDENT_AMBULATORY_CARE_PROVIDER_SITE_OTHER): Payer: Medicaid Other | Admitting: Advanced Practice Midwife

## 2020-12-09 ENCOUNTER — Other Ambulatory Visit: Payer: Self-pay

## 2020-12-09 ENCOUNTER — Encounter: Payer: Self-pay | Admitting: Advanced Practice Midwife

## 2020-12-09 VITALS — BP 120/80 | Wt 157.0 lb

## 2020-12-09 DIAGNOSIS — Z3482 Encounter for supervision of other normal pregnancy, second trimester: Secondary | ICD-10-CM

## 2020-12-09 DIAGNOSIS — Z369 Encounter for antenatal screening, unspecified: Secondary | ICD-10-CM

## 2020-12-09 DIAGNOSIS — Z3A18 18 weeks gestation of pregnancy: Secondary | ICD-10-CM

## 2020-12-09 NOTE — Progress Notes (Signed)
No vb. No lof.  

## 2020-12-09 NOTE — Progress Notes (Signed)
  Routine Prenatal Care Visit  Subjective  Rachel Richards is a 25 y.o. G2P1001 at [redacted]w[redacted]d being seen today for ongoing prenatal care.  She is currently monitored for the following issues for this low-risk pregnancy and has Herpes simplex; Vision changes; and Supervision of normal pregnancy on their problem list.  ----------------------------------------------------------------------------------- Patient reports no complaints.   Contractions: Not present. Vag. Bleeding: None.  Movement: Absent. Leaking Fluid denies.  ----------------------------------------------------------------------------------- The following portions of the patient's history were reviewed and updated as appropriate: allergies, current medications, past family history, past medical history, past social history, past surgical history and problem list. Problem list updated.  Objective  Blood pressure 120/80, weight 157 lb (71.2 kg), last menstrual period 08/02/2020. Pregravid weight 154 lb (69.9 kg) Total Weight Gain 3 lb (1.361 kg) Urinalysis: Urine Protein    Urine Glucose    Fetal Status: Fetal Heart Rate (bpm): 148   Movement: Absent     General:  Alert, oriented and cooperative. Patient is in no acute distress.  Skin: Skin is warm and dry. No rash noted.   Cardiovascular: Normal heart rate noted  Respiratory: Normal respiratory effort, no problems with respiration noted  Abdomen: Soft, gravid, appropriate for gestational age. Pain/Pressure: Absent     Pelvic:  Cervical exam deferred        Extremities: Normal range of motion.  Edema: None  Mental Status: Normal mood and affect. Normal behavior. Normal judgment and thought content.   Assessment   25 y.o. G2P1001 at [redacted]w[redacted]d by  05/09/2021, by Last Menstrual Period presenting for routine prenatal visit  Plan   Pregnancy2 Problems (from 10/13/20 to present)    Problem Noted Resolved   Supervision of normal pregnancy 10/13/2020 by Tresea Mall, CNM No   Overview  Addendum 10/13/2020  1:26 PM by Tresea Mall, CNM     Nursing Staff Provider  Office Location  [facility] Dating    Language  English Anatomy US    Flu Vaccine   Genetic Screen  NIPS:   TDaP vaccine    Hgb A1C or  GTT Early : NA Third trimester :   Covid    LAB RESULTS   Rhogam   Blood Type     Feeding Plan Breast Antibody    Contraception  Rubella    Circumcision  RPR     Pediatrician   HBsAg     Support Person [name] HIV    Prenatal Classes  Varicella     GBS  (For PCN allergy, check sensitivities)   BTL Consent     VBAC Consent NA Pap 2019 negative, 10/13/20:     Hgb Electro    Pelvis Tested 6#11oz CF      SMA     HSV+ 36 wk PPX [ ]              Preterm labor symptoms and general obstetric precautions including but not limited to vaginal bleeding, contractions, leaking of fluid and fetal movement were reviewed in detail with the patient.    Return in about 2 weeks (around 12/23/2020) for anatomy scan and 4 weeks rob.  13/11/2020, CNM 12/09/2020 4:28 PM

## 2020-12-20 ENCOUNTER — Ambulatory Visit
Admission: RE | Admit: 2020-12-20 | Discharge: 2020-12-20 | Disposition: A | Discharge status: Home / Self Care | Payer: Medicaid Other | Source: Ambulatory Visit | Attending: Advanced Practice Midwife | Admitting: Advanced Practice Midwife

## 2020-12-20 ENCOUNTER — Other Ambulatory Visit: Payer: Self-pay

## 2020-12-20 DIAGNOSIS — Z3482 Encounter for supervision of other normal pregnancy, second trimester: Secondary | ICD-10-CM | POA: Insufficient documentation

## 2020-12-20 DIAGNOSIS — Z369 Encounter for antenatal screening, unspecified: Secondary | ICD-10-CM | POA: Insufficient documentation

## 2020-12-21 ENCOUNTER — Encounter: Payer: Self-pay | Admitting: Advanced Practice Midwife

## 2020-12-21 ENCOUNTER — Ambulatory Visit (INDEPENDENT_AMBULATORY_CARE_PROVIDER_SITE_OTHER): Payer: Medicaid Other | Admitting: Advanced Practice Midwife

## 2020-12-21 VITALS — BP 115/70 | Ht 62.0 in | Wt 159.6 lb

## 2020-12-21 DIAGNOSIS — Z3482 Encounter for supervision of other normal pregnancy, second trimester: Secondary | ICD-10-CM

## 2020-12-21 DIAGNOSIS — Z3A20 20 weeks gestation of pregnancy: Secondary | ICD-10-CM

## 2020-12-21 LAB — POCT URINALYSIS DIPSTICK OB
Glucose, UA: NEGATIVE
POC,PROTEIN,UA: NEGATIVE

## 2020-12-21 NOTE — Progress Notes (Signed)
  Routine Prenatal Care Visit  Subjective  Rachel Richards is a 25 y.o. G2P1001 at [redacted]w[redacted]d being seen today for ongoing prenatal care.  She is currently monitored for the following issues for this low-risk pregnancy and has Herpes simplex; Vision changes; and Supervision of normal pregnancy on their problem list.  ----------------------------------------------------------------------------------- Patient reports no complaints.   Contractions: Not present. Vag. Bleeding: None.  Movement: Present. Leaking Fluid denies.  ----------------------------------------------------------------------------------- The following portions of the patient's history were reviewed and updated as appropriate: allergies, current medications, past family history, past medical history, past social history, past surgical history and problem list. Problem list updated.  Objective  Blood pressure 115/70, height 5\' 2"  (1.575 m), weight 159 lb 9.6 oz (72.4 kg), last menstrual period 08/02/2020. Pregravid weight 154 lb (69.9 kg) Total Weight Gain 5 lb 9.6 oz (2.54 kg) Urinalysis: Urine Protein    Urine Glucose    Fetal Status: Fetal Heart Rate (bpm): 139 Fundal Height: 20 cm Movement: Present      Anatomy scan done 12/20/20: report not available, patient reports no concerns noted during scan  General:  Alert, oriented and cooperative. Patient is in no acute distress.  Skin: Skin is warm and dry. No rash noted.   Cardiovascular: Normal heart rate noted  Respiratory: Normal respiratory effort, no problems with respiration noted  Abdomen: Soft, gravid, appropriate for gestational age. Pain/Pressure: Absent     Pelvic:  Cervical exam deferred        Extremities: Normal range of motion.  Edema: None  Mental Status: Normal mood and affect. Normal behavior. Normal judgment and thought content.   Assessment   25 y.o. G2P1001 at [redacted]w[redacted]d by  05/09/2021, by Last Menstrual Period presenting for routine prenatal visit  Plan    Pregnancy2 Problems (from 10/13/20 to present)    Problem Noted Resolved   Supervision of normal pregnancy 10/13/2020 by 10/15/2020, CNM No   Overview Addendum 10/13/2020  1:26 PM by 10/15/2020, CNM     Nursing Staff Provider  Office Location  [facility] Dating    Language  English Anatomy Tresea Mall    Flu Vaccine   Genetic Screen  NIPS:   TDaP vaccine    Hgb A1C or  GTT Early : NA Third trimester :   Covid    LAB RESULTS   Rhogam   Blood Type     Feeding Plan Breast Antibody    Contraception  Rubella    Circumcision  RPR     Pediatrician   HBsAg     Support Person [place] HIV    Prenatal Classes  Varicella     GBS  (For PCN allergy, check sensitivities)   BTL Consent     VBAC Consent NA Pap 2019 negative, 10/13/20:     Hgb Electro    Pelvis Tested 6#11oz CF      SMA     HSV+ 36 wk PPX [ ]              Preterm labor symptoms and general obstetric precautions including but not limited to vaginal bleeding, contractions, leaking of fluid and fetal movement were reviewed in detail with the patient.    Return in about 4 weeks (around 01/18/2021) for rob.  , CNM 12/21/2020 2:11 PM

## 2021-01-10 ENCOUNTER — Encounter: Payer: Medicaid Other | Admitting: Advanced Practice Midwife

## 2021-01-18 ENCOUNTER — Other Ambulatory Visit: Payer: Self-pay

## 2021-01-18 ENCOUNTER — Ambulatory Visit (INDEPENDENT_AMBULATORY_CARE_PROVIDER_SITE_OTHER): Payer: Medicaid Other | Admitting: Advanced Practice Midwife

## 2021-01-18 VITALS — BP 112/70 | Wt 165.0 lb

## 2021-01-18 DIAGNOSIS — Z3482 Encounter for supervision of other normal pregnancy, second trimester: Secondary | ICD-10-CM

## 2021-01-18 DIAGNOSIS — Z130 Encounter for screening for diseases of the blood and blood-forming organs and certain disorders involving the immune mechanism: Secondary | ICD-10-CM

## 2021-01-18 DIAGNOSIS — Z131 Encounter for screening for diabetes mellitus: Secondary | ICD-10-CM

## 2021-01-18 DIAGNOSIS — Z113 Encounter for screening for infections with a predominantly sexual mode of transmission: Secondary | ICD-10-CM

## 2021-01-18 DIAGNOSIS — Z369 Encounter for antenatal screening, unspecified: Secondary | ICD-10-CM

## 2021-01-18 DIAGNOSIS — Z3A24 24 weeks gestation of pregnancy: Secondary | ICD-10-CM

## 2021-01-18 LAB — POCT URINALYSIS DIPSTICK OB
Glucose, UA: NEGATIVE
POC,PROTEIN,UA: NEGATIVE

## 2021-01-18 NOTE — Addendum Note (Signed)
Addended by: Cornelius Moras D on: 01/18/2021 02:50 PM   Modules accepted: Orders

## 2021-01-18 NOTE — Progress Notes (Signed)
  Routine Prenatal Care Visit  Subjective  Rachel Richards is a 25 y.o. G2P1001 at [redacted]w[redacted]d being seen today for ongoing prenatal care.  She is currently monitored for the following issues for this low-risk pregnancy and has Herpes simplex; Vision changes; and Supervision of normal pregnancy on their problem list.  ----------------------------------------------------------------------------------- Patient reports no complaints.   Contractions: Not present. Vag. Bleeding: None.  Movement: Present. Leaking Fluid denies.  ----------------------------------------------------------------------------------- The following portions of the patient's history were reviewed and updated as appropriate: allergies, current medications, past family history, past medical history, past social history, past surgical history and problem list. Problem list updated.  Objective  Blood pressure 112/70, weight 165 lb (74.8 kg), last menstrual period 08/02/2020. Pregravid weight 154 lb (69.9 kg) Total Weight Gain 11 lb (4.99 kg) Urinalysis: Urine Protein    Urine Glucose    Fetal Status: Fetal Heart Rate (bpm): 145 Fundal Height: 24 cm Movement: Present     General:  Alert, oriented and cooperative. Patient is in no acute distress.  Skin: Skin is warm and dry. No rash noted.   Cardiovascular: Normal heart rate noted  Respiratory: Normal respiratory effort, no problems with respiration noted  Abdomen: Soft, gravid, appropriate for gestational age. Pain/Pressure: Absent     Pelvic:  Cervical exam deferred        Extremities: Normal range of motion.  Edema: None  Mental Status: Normal mood and affect. Normal behavior. Normal judgment and thought content.   Assessment   25 y.o. G2P1001 at [redacted]w[redacted]d by  05/09/2021, by Last Menstrual Period presenting for routine prenatal visit  Plan   Pregnancy2 Problems (from 10/13/20 to present)    Problem Noted Resolved   Supervision of normal pregnancy 10/13/2020 by Tresea Mall,  CNM No   Overview Addendum 10/13/2020  1:26 PM by Tresea Mall, CNM     Nursing Staff Provider  Office Location  [facility] Dating    Language  English Anatomy US    Flu Vaccine   Genetic Screen  NIPS:   TDaP vaccine    Hgb A1C or  GTT Early : NA Third trimester :   Covid    LAB RESULTS   Rhogam   Blood Type     Feeding Plan Breast Antibody    Contraception  Rubella    Circumcision  RPR     Pediatrician   HBsAg     Support Person [name] HIV    Prenatal Classes  Varicella     GBS  (For PCN allergy, check sensitivities)   BTL Consent     VBAC Consent NA Pap 2019 negative, 10/13/20:     Hgb Electro    Pelvis Tested 6#11oz CF      SMA     HSV+ 36 wk PPX [ ]              Preterm labor symptoms and general obstetric precautions including but not limited to vaginal bleeding, contractions, leaking of fluid and fetal movement were reviewed in detail with the patient.   Return in about 4 weeks (around 02/15/2021) for 28 wk labs and rob.  04/15/2021, CNM 01/18/2021 2:41 PM

## 2021-02-13 NOTE — L&D Delivery Note (Signed)
Date of delivery: 04/29/2021 ?Estimated Date of Delivery: 05/09/21 ?Patient's last menstrual period was 08/02/2020 (within days). ?EGA: [redacted]w[redacted]d ? ?Delivery Note ?At 8:40 PM a viable female was delivered via Vaginal, Spontaneous  ?Presentation: Left Occiput Anterior, left compound hand ?APGAR: 9, 9    Weight: 3730 g, 8 pounds 4 ounces ?Placenta status: Spontaneous, Intact.   ?Cord: 3 vessels with the following complications: None.  Cord pH: NA ? ?Called to see patient.  Mom pushed to deliver a viable female infant.  The head followed by shoulders and compound left hand which delivered without difficulty, and the rest of the body.  No nuchal cord noted.  Baby to mom's chest.  Cord clamped and cut after 4 min delay.  Cord blood obtained.  Placenta delivered spontaneously, intact, with a 3-vessel cord.  Second degree perineal laceration repaired with 3-0 Vicryl in standard fashion.  All counts correct.  Hemostasis obtained with IV pitocin and fundal massage.    ? ?Anesthesia: Epidural ?Episiotomy: None ?Lacerations: 2nd degree ?Suture Repair: 3.0 vicryl ?Est. Blood Loss (mL):  450 ? ?Mom to postpartum.  Baby to Couplet care / Skin to Skin. ? ?Tresea Mall, CNM ?04/29/2021, 9:18 PM ? ? ? ?

## 2021-02-16 ENCOUNTER — Ambulatory Visit (INDEPENDENT_AMBULATORY_CARE_PROVIDER_SITE_OTHER): Payer: Medicaid Other | Admitting: Advanced Practice Midwife

## 2021-02-16 ENCOUNTER — Encounter: Payer: Self-pay | Admitting: Advanced Practice Midwife

## 2021-02-16 ENCOUNTER — Other Ambulatory Visit: Payer: Medicaid Other

## 2021-02-16 ENCOUNTER — Other Ambulatory Visit: Payer: Self-pay

## 2021-02-16 VITALS — BP 120/80 | Wt 167.0 lb

## 2021-02-16 DIAGNOSIS — Z113 Encounter for screening for infections with a predominantly sexual mode of transmission: Secondary | ICD-10-CM

## 2021-02-16 DIAGNOSIS — Z369 Encounter for antenatal screening, unspecified: Secondary | ICD-10-CM

## 2021-02-16 DIAGNOSIS — Z130 Encounter for screening for diseases of the blood and blood-forming organs and certain disorders involving the immune mechanism: Secondary | ICD-10-CM

## 2021-02-16 DIAGNOSIS — Z131 Encounter for screening for diabetes mellitus: Secondary | ICD-10-CM

## 2021-02-16 DIAGNOSIS — Z3482 Encounter for supervision of other normal pregnancy, second trimester: Secondary | ICD-10-CM

## 2021-02-16 DIAGNOSIS — Z3483 Encounter for supervision of other normal pregnancy, third trimester: Secondary | ICD-10-CM

## 2021-02-16 DIAGNOSIS — Z3A28 28 weeks gestation of pregnancy: Secondary | ICD-10-CM

## 2021-02-16 NOTE — Progress Notes (Signed)
°  Routine Prenatal Care Visit  Subjective  Rachel Richards is a 26 y.o. G2P1001 at [redacted]w[redacted]d being seen today for ongoing prenatal care.  She is currently monitored for the following issues for this low-risk pregnancy and has Herpes simplex; Vision changes; and Supervision of normal pregnancy on their problem list.  ----------------------------------------------------------------------------------- Patient reports no complaints.   Contractions: Not present. Vag. Bleeding: None.  Movement: Present. Leaking Fluid denies.  ----------------------------------------------------------------------------------- The following portions of the patient's history were reviewed and updated as appropriate: allergies, current medications, past family history, past medical history, past social history, past surgical history and problem list. Problem list updated.  Objective  Blood pressure 120/80, weight 167 lb (75.8 kg), last menstrual period 08/02/2020. Pregravid weight 154 lb (69.9 kg) Total Weight Gain 13 lb (5.897 kg) Urinalysis: Urine Protein    Urine Glucose    Fetal Status: Fetal Heart Rate (bpm): 127 Fundal Height: 28 cm Movement: Present     General:  Alert, oriented and cooperative. Patient is in no acute distress.  Skin: Skin is warm and dry. No rash noted.   Cardiovascular: Normal heart rate noted  Respiratory: Normal respiratory effort, no problems with respiration noted  Abdomen: Soft, gravid, appropriate for gestational age. Pain/Pressure: Absent     Pelvic:  Cervical exam deferred        Extremities: Normal range of motion.  Edema: None  Mental Status: Normal mood and affect. Normal behavior. Normal judgment and thought content.   Assessment   26 y.o. G2P1001 at [redacted]w[redacted]d by  05/09/2021, by Last Menstrual Period presenting for routine prenatal visit  Plan   Pregnancy2 Problems (from 10/13/20 to present)    Problem Noted Resolved   Supervision of normal pregnancy 10/13/2020 by Tresea Mall,  CNM No   Overview Addendum 10/13/2020  1:26 PM by Tresea Mall, CNM     Nursing Staff Provider  Office Location  [facility] Dating    Language  English Anatomy US    Flu Vaccine   Genetic Screen  NIPS:   TDaP vaccine    Hgb A1C or  GTT Early : NA Third trimester :   Covid    LAB RESULTS   Rhogam   Blood Type     Feeding Plan Breast Antibody    Contraception  Rubella    Circumcision  RPR     Pediatrician   HBsAg     Support Person [name] HIV    Prenatal Classes  Varicella     GBS  (For PCN allergy, check sensitivities)   BTL Consent     VBAC Consent NA Pap 2019 negative, 10/13/20:     Hgb Electro    Pelvis Tested 6#11oz CF      SMA     HSV+ 36 wk PPX [ ]              Preterm labor symptoms and general obstetric precautions including but not limited to vaginal bleeding, contractions, leaking of fluid and fetal movement were reviewed in detail with the patient. Please refer to After Visit Summary for other counseling recommendations.   Return in about 2 weeks (around 03/02/2021) for rob.  03/04/2021, CNM 02/16/2021 9:43 AM

## 2021-02-16 NOTE — Patient Instructions (Signed)

## 2021-02-17 LAB — 28 WEEK RH+PANEL
Basophils Absolute: 0 10*3/uL (ref 0.0–0.2)
Basos: 0 %
EOS (ABSOLUTE): 0.2 10*3/uL (ref 0.0–0.4)
Eos: 2 %
Gestational Diabetes Screen: 160 mg/dL — ABNORMAL HIGH (ref 70–139)
HIV Screen 4th Generation wRfx: NONREACTIVE
Hematocrit: 37 % (ref 34.0–46.6)
Hemoglobin: 12.6 g/dL (ref 11.1–15.9)
Immature Grans (Abs): 0.2 10*3/uL — ABNORMAL HIGH (ref 0.0–0.1)
Immature Granulocytes: 2 %
Lymphocytes Absolute: 1.3 10*3/uL (ref 0.7–3.1)
Lymphs: 15 %
MCH: 30.4 pg (ref 26.6–33.0)
MCHC: 34.1 g/dL (ref 31.5–35.7)
MCV: 89 fL (ref 79–97)
Monocytes Absolute: 0.4 10*3/uL (ref 0.1–0.9)
Monocytes: 5 %
Neutrophils Absolute: 6.7 10*3/uL (ref 1.4–7.0)
Neutrophils: 76 %
Platelets: 248 10*3/uL (ref 150–450)
RBC: 4.15 x10E6/uL (ref 3.77–5.28)
RDW: 13 % (ref 11.7–15.4)
RPR Ser Ql: NONREACTIVE
WBC: 8.7 10*3/uL (ref 3.4–10.8)

## 2021-02-21 ENCOUNTER — Other Ambulatory Visit: Payer: Self-pay

## 2021-02-21 ENCOUNTER — Other Ambulatory Visit: Payer: Medicaid Other

## 2021-02-21 ENCOUNTER — Other Ambulatory Visit: Payer: Self-pay | Admitting: Obstetrics and Gynecology

## 2021-02-21 DIAGNOSIS — R7309 Other abnormal glucose: Secondary | ICD-10-CM

## 2021-02-22 LAB — GESTATIONAL GLUCOSE TOLERANCE
Glucose, Fasting: 82 mg/dL (ref 70–94)
Glucose, GTT - 1 Hour: 133 mg/dL (ref 70–179)
Glucose, GTT - 2 Hour: 134 mg/dL (ref 70–154)
Glucose, GTT - 3 Hour: 127 mg/dL (ref 70–139)

## 2021-03-02 ENCOUNTER — Ambulatory Visit (INDEPENDENT_AMBULATORY_CARE_PROVIDER_SITE_OTHER): Payer: Medicaid Other | Admitting: Advanced Practice Midwife

## 2021-03-02 ENCOUNTER — Encounter: Payer: Self-pay | Admitting: Advanced Practice Midwife

## 2021-03-02 ENCOUNTER — Encounter: Payer: Medicaid Other | Admitting: Advanced Practice Midwife

## 2021-03-02 ENCOUNTER — Other Ambulatory Visit: Payer: Self-pay

## 2021-03-02 VITALS — BP 116/72 | Wt 166.6 lb

## 2021-03-02 DIAGNOSIS — Z3A30 30 weeks gestation of pregnancy: Secondary | ICD-10-CM

## 2021-03-02 DIAGNOSIS — Z3483 Encounter for supervision of other normal pregnancy, third trimester: Secondary | ICD-10-CM

## 2021-03-02 LAB — POCT URINALYSIS DIPSTICK OB
Glucose, UA: NEGATIVE
POC,PROTEIN,UA: NEGATIVE

## 2021-03-02 NOTE — Progress Notes (Signed)
Routine Prenatal Care Visit  Subjective  Rachel Richards is a 26 y.o. G2P1001 at [redacted]w[redacted]d being seen today for ongoing prenatal care.  She is currently monitored for the following issues for this low-risk pregnancy and has Herpes simplex; Vision changes; and Supervision of normal pregnancy on their problem list.  ----------------------------------------------------------------------------------- Patient reports left side lower abdominal pain and low back pain. Comfort measures reviewed.   Contractions: Not present. Vag. Bleeding: None.  Movement: Present. Leaking Fluid denies.  ----------------------------------------------------------------------------------- The following portions of the patient's history were reviewed and updated as appropriate: allergies, current medications, past family history, past medical history, past social history, past surgical history and problem list. Problem list updated.  Objective  Blood pressure 116/72, weight 166 lb 9.6 oz (75.6 kg), last menstrual period 08/02/2020. Pregravid weight 154 lb (69.9 kg) Total Weight Gain 12 lb 9.6 oz (5.715 kg) Urinalysis: Urine Protein negative  Urine Glucose negative  Fetal Status: Fetal Heart Rate (bpm): 140   Movement: Present     General:  Alert, oriented and cooperative. Patient is in no acute distress.  Skin: Skin is warm and dry. No rash noted.   Cardiovascular: Normal heart rate noted  Respiratory: Normal respiratory effort, no problems with respiration noted  Abdomen: Soft, gravid, appropriate for gestational age. Pain/Pressure: Present     Pelvic:  Cervical exam deferred        Extremities: Normal range of motion.     Mental Status: Normal mood and affect. Normal behavior. Normal judgment and thought content.   Assessment   26 y.o. G2P1001 at [redacted]w[redacted]d by  05/09/2021, by Last Menstrual Period presenting for routine prenatal visit  Plan   Pregnancy2 Problems (from 10/13/20 to present)    Problem Noted Resolved    Supervision of normal pregnancy 10/13/2020 by Tresea Mall, CNM No   Overview Addendum 10/13/2020  1:26 PM by Tresea Mall, CNM     Nursing Staff Provider  Office Location  [facility] Dating    Language  English Anatomy US    Flu Vaccine   Genetic Screen  NIPS:   TDaP vaccine    Hgb A1C or  GTT Early : NA Third trimester :   Covid    LAB RESULTS   Rhogam   Blood Type     Feeding Plan Breast Antibody    Contraception  Rubella    Circumcision  RPR     Pediatrician   HBsAg     Support Person [name] HIV    Prenatal Classes  Varicella     GBS  (For PCN allergy, check sensitivities)   BTL Consent     VBAC Consent NA Pap 2019 negative, 10/13/20:     Hgb Electro    Pelvis Tested 6#11oz CF      SMA     HSV+ 36 wk PPX [ ]              Preterm labor symptoms and general obstetric precautions including but not limited to vaginal bleeding, contractions, leaking of fluid and fetal movement were reviewed in detail with the patient.    Return in about 2 weeks (around 03/16/2021) for rob.  05/14/2021, CNM 03/02/2021 10:26 AM

## 2021-03-16 ENCOUNTER — Ambulatory Visit (INDEPENDENT_AMBULATORY_CARE_PROVIDER_SITE_OTHER): Payer: Medicaid Other | Admitting: Advanced Practice Midwife

## 2021-03-16 ENCOUNTER — Other Ambulatory Visit: Payer: Self-pay

## 2021-03-16 ENCOUNTER — Encounter: Payer: Self-pay | Admitting: Advanced Practice Midwife

## 2021-03-16 VITALS — BP 126/82 | Wt 167.0 lb

## 2021-03-16 DIAGNOSIS — Z3A32 32 weeks gestation of pregnancy: Secondary | ICD-10-CM

## 2021-03-16 DIAGNOSIS — Z3483 Encounter for supervision of other normal pregnancy, third trimester: Secondary | ICD-10-CM

## 2021-03-16 NOTE — Progress Notes (Signed)
No vb. No lof.  

## 2021-03-16 NOTE — Progress Notes (Signed)
Routine Prenatal Care Visit  Subjective  Rachel Richards is a 26 y.o. G2P1001 at [redacted]w[redacted]d being seen today for ongoing prenatal care.  She is currently monitored for the following issues for this low-risk pregnancy and has Herpes simplex; Vision changes; and Supervision of normal pregnancy on their problem list.  ----------------------------------------------------------------------------------- Patient reports no complaints.   Contractions: Not present. Vag. Bleeding: None.  Movement: Present. Leaking Fluid denies.  ----------------------------------------------------------------------------------- The following portions of the patient's history were reviewed and updated as appropriate: allergies, current medications, past family history, past medical history, past social history, past surgical history and problem list. Problem list updated.  Objective  Blood pressure 126/82, weight 167 lb (75.8 kg), last menstrual period 08/02/2020. Pregravid weight 154 lb (69.9 kg) Total Weight Gain 13 lb (5.897 kg) Urinalysis: Urine Protein    Urine Glucose    Fetal Status: Fetal Heart Rate (bpm): 131 Fundal Height: 32 cm Movement: Present     General:  Alert, oriented and cooperative. Patient is in no acute distress.  Skin: Skin is warm and dry. No rash noted.   Cardiovascular: Normal heart rate noted  Respiratory: Normal respiratory effort, no problems with respiration noted  Abdomen: Soft, gravid, appropriate for gestational age. Pain/Pressure: Absent     Pelvic:  Cervical exam deferred        Extremities: Normal range of motion.  Edema: None  Mental Status: Normal mood and affect. Normal behavior. Normal judgment and thought content.   Assessment   26 y.o. G2P1001 at [redacted]w[redacted]d by  05/09/2021, by Last Menstrual Period presenting for routine prenatal visit  Plan   Pregnancy2 Problems (from 10/13/20 to present)     Problem Noted Resolved   Supervision of normal pregnancy 10/13/2020 by Rod Can,  CNM No   Overview Addendum 10/13/2020  1:26 PM by Rod Can, CNM     Nursing Staff Provider  Office Location  [facility] Dating    Language  English Anatomy US    Flu Vaccine   Genetic Screen  NIPS:   TDaP vaccine   06/20/17 Hgb A1C or  GTT Early : NA Third trimester :   Covid    LAB RESULTS   Rhogam   Blood Type     Feeding Plan Breast Antibody    Contraception  Rubella    Circumcision  RPR     Pediatrician   HBsAg     Support Person Marland Kitchen HIV    Prenatal Classes  Varicella     GBS  (For PCN allergy, check sensitivities)   BTL Consent     VBAC Consent NA Pap 2019 negative, 10/13/20:     Hgb Electro    Pelvis Tested 6#11oz CF      SMA     HSV+ 36 wk PPX [ ]               Preterm labor symptoms and general obstetric precautions including but not limited to vaginal bleeding, contractions, leaking of fluid and fetal movement were reviewed in detail with the patient.   Return in about 2 weeks (around 03/30/2021) for rob.  Rod Can, CNM 03/16/2021 10:35 AM

## 2021-03-21 ENCOUNTER — Encounter: Payer: Self-pay | Admitting: Advanced Practice Midwife

## 2021-03-24 ENCOUNTER — Encounter: Payer: Medicaid Other | Admitting: Obstetrics and Gynecology

## 2021-03-25 ENCOUNTER — Ambulatory Visit (INDEPENDENT_AMBULATORY_CARE_PROVIDER_SITE_OTHER): Payer: Medicaid Other

## 2021-03-25 ENCOUNTER — Encounter: Payer: Self-pay | Admitting: Advanced Practice Midwife

## 2021-03-25 ENCOUNTER — Other Ambulatory Visit: Payer: Self-pay

## 2021-03-25 VITALS — BP 122/80

## 2021-03-25 DIAGNOSIS — Z0130 Encounter for examination of blood pressure without abnormal findings: Secondary | ICD-10-CM | POA: Diagnosis not present

## 2021-03-25 LAB — POCT URINALYSIS DIPSTICK OB: Glucose, UA: NEGATIVE

## 2021-03-25 NOTE — Telephone Encounter (Signed)
Contacted patient via phone. Patient is aware to head on to [facility] to be seen

## 2021-03-25 NOTE — Progress Notes (Signed)
Pt denies headaches, seeing spots, blurred visions. Checked her urine, normal. Aware to give [place] a call or head to ER if sx mentioned above develop and if BP readings at home are greater than 140/90. Per Crystal, it's okay to wait until pt's next OB appt on 03/30/21 if BP readings are normal.

## 2021-03-25 NOTE — Addendum Note (Signed)
Addended by: Donnetta Hail on: 03/25/2021 03:44 PM   Modules accepted: Orders

## 2021-03-25 NOTE — Telephone Encounter (Signed)
Pt states BP at PCP was 142/93 and pulse was 118.  [phone]

## 2021-03-30 ENCOUNTER — Ambulatory Visit (INDEPENDENT_AMBULATORY_CARE_PROVIDER_SITE_OTHER): Payer: Medicaid Other | Admitting: Advanced Practice Midwife

## 2021-03-30 ENCOUNTER — Other Ambulatory Visit: Payer: Self-pay

## 2021-03-30 ENCOUNTER — Encounter: Payer: Medicaid Other | Admitting: Advanced Practice Midwife

## 2021-03-30 ENCOUNTER — Encounter: Payer: Self-pay | Admitting: Advanced Practice Midwife

## 2021-03-30 VITALS — BP 122/70 | Wt 170.0 lb

## 2021-03-30 DIAGNOSIS — Z3A34 34 weeks gestation of pregnancy: Secondary | ICD-10-CM

## 2021-03-30 DIAGNOSIS — Z3483 Encounter for supervision of other normal pregnancy, third trimester: Secondary | ICD-10-CM

## 2021-03-30 NOTE — Patient Instructions (Signed)
Hypocalcemia, Adult Hypocalcemia is when the level of calcium in a person's blood is below normal. Calcium is a mineral that is used by the body in many ways. Not having enough blood calcium can affect the nervous system. This can lead to problems with the muscles, the heart, and the brain. What are the causes? This condition may be caused by: A deficiency of vitamin D or magnesium or both. Decreased levels of parathyroid hormone (hypoparathyroidism). Kidney function problems. Low levels of a body protein called albumin. Inflammation of the pancreas (pancreatitis). Not taking in enough vitamins and minerals in the diet or having intestinal problems that interfere with absorption of nutrients. Certain medicines. What are the signs or symptoms? Symptoms of this condition may include: Numbness and tingling in the fingers, toes, or around the mouth. Muscle twitching, aches, or cramps, especially in the legs, feet, and back. Spasm of the voice box. This may make it difficult to breathe or speak. Fast heartbeats (palpitations) and abnormal heart rhythms (dysrhythmias). Shaking uncontrollably (seizures). Memory problems, confusion, or difficulty thinking. Depression, anxiety, irritability, or changes in personality. Long-term (chronic) symptoms of this condition may include: Coarse, brittle hair and nails. Dry skin or lasting skin diseases. These include psoriasis, eczema, or dermatitis. Dental cavities. Clouding of the eye lens (cataracts). Some people may not have any symptoms, especially if they have chronic hypocalcemia. How is this diagnosed? This condition is usually diagnosed with a blood test. You may also have other tests to help determine the underlying cause of the condition. This may include more blood tests and imaging tests. How is this treated? This condition may be treated with: Calcium given by mouth or through an IV. The method used for giving calcium will depend on the  severity of the condition. If your condition is severe, you may need to be closely monitored in the hospital. Giving other minerals (electrolytes), such as magnesium. Other treatment will depend on the cause of the condition. Follow these instructions at home: Follow diet instructions from your health care provider or dietitian. Take supplements only as told by your health care provider. Do not take an iron supplement within 2 hours of taking a calcium supplement. Keep all follow-up visits. This is important. Contact a health care provider if: You have increased muscle twitching or cramps. You have new swelling in the feet, ankles, or legs. You develop changes in mood, memory, or personality. Get help right away if: You have chest pain. You have persistent rapid or irregular heartbeats. You have trouble breathing. You faint. You start to have seizures. You have confusion. These symptoms may represent a serious problem that is an emergency. Do not wait to see if the symptoms will go away. Get medical help right away. Call your local emergency services (911 in the [place] Do not drive yourself to the hospital. Summary Hypocalcemia is when the level of calcium in a person's blood is below normal. Not having enough blood calcium can affect the nervous system. This condition may be treated with calcium given by mouth or through an IV, or by receiving other minerals. Other treatment depends on the cause of the condition. Take supplements only as told by your health care provider. Contact a health care provider if you have new or worsening symptoms. Keep all follow-up visits. This is important. This information is not intended to replace advice given to you by your health care provider. Make sure you discuss any questions you have with your health care provider. Document Revised: 07/07/2020  Document Reviewed: 07/07/2020 Elsevier Patient Education  2022 [place]. Vitamin D Deficiency Vitamin  D deficiency is when your body does not have enough vitamin D. Vitamin D is important to your body for many reasons: It helps the body absorb two important minerals--calcium and phosphorus. It plays a role in bone health. It may help to prevent some diseases, such as diabetes and multiple sclerosis. It plays a role in muscle function, including heart function. If vitamin D deficiency is severe, it can cause a condition in which your bones become soft. In adults, this condition is called osteomalacia. In children, this condition is called rickets. What are the causes? This condition may be caused by: Not eating enough foods that contain vitamin D. Not getting enough natural sun exposure. Having certain digestive system diseases that make it difficult for your body to absorb vitamin D. These diseases include Crohn's disease, chronic pancreatitis, and cystic fibrosis. Having a surgery in which a part of the stomach or a part of the small intestine is removed. Having chronic kidney disease or liver disease. What increases the risk? You are more likely to develop this condition if you: Are older. Do not spend much time outdoors. Live in a long-term care facility. Have had broken bones. Have weak or thin bones (osteoporosis). Have a disease or condition that changes how the body absorbs vitamin D. Have dark skin. Take certain medicines, such as steroid medicines or certain seizure medicines. Are overweight or obese. What are the signs or symptoms? In mild cases of vitamin D deficiency, there may not be any symptoms. If the condition is severe, symptoms may include: Bone pain. Muscle pain. Falling often. Broken bones caused by a minor injury. How is this diagnosed? This condition may be diagnosed with blood tests. Imaging tests such as X-rays may also be done to look for changes in the bone. How is this treated? Treatment for this condition may depend on what caused the condition. Treatment  options include: Taking vitamin D supplements. Your health care provider will suggest what dose is best for you. Taking a calcium supplement. Your health care provider will suggest what dose is best for you. Follow these instructions at home: Eating and drinking  Eat foods that contain vitamin D. Choices include: Fortified dairy products, cereals, or juices. Fortified means that vitamin D has been added to the food. Check the label on the package to see if the food is fortified. Fatty fish, such as salmon or trout. Eggs. Oysters. Mushrooms. The items listed above may not be a complete list of recommended foods and beverages. Contact a dietitian for more information. General instructions Take medicines and supplements only as told by your health care provider. Get regular, safe exposure to natural sunlight. Do not use a tanning bed. Maintain a healthy weight. Lose weight if needed. Keep all follow-up visits as told by your health care provider. This is important. How is this prevented? You can get vitamin D by: Eating foods that naturally contain vitamin D. Eating or drinking products that have been fortified with vitamin D, such as cereals, juices, and dairy products (including milk). Taking a vitamin D supplement or a multivitamin supplement that contains vitamin D. Being in the sun. Your body naturally makes vitamin D when your skin is exposed to sunlight. Your body changes the sunlight into a form of the vitamin that it can use. Contact a health care provider if: Your symptoms do not go away. You feel nauseous or you vomit. You  have fewer bowel movements than usual or are constipated. Summary Vitamin D deficiency is when your body does not have enough vitamin D. Vitamin D is important to your body for good bone health and muscle function, and it may help prevent some diseases. Vitamin D deficiency is primarily treated through supplementation. Your health care provider will suggest  what dose is best for you. You can get vitamin D by eating foods that contain vitamin D, by being in the sun, and by taking a vitamin D supplement or a multivitamin supplement that contains vitamin D. This information is not intended to replace advice given to you by your health care provider. Make sure you discuss any questions you have with your health care provider. Document Revised: 10/08/2017 Document Reviewed: 10/08/2017 Elsevier Patient Education  2022 [place]. Hypokalemia Hypokalemia means that the amount of potassium in the blood is lower than normal. Potassium is a chemical (electrolyte) that helps regulate the amount of fluid in the body. It also stimulates muscle tightening (contraction) and helps nerves work properly. Normally, most of the body's potassium is inside cells, and only a very small amount is in the blood. Because the amount in the blood is so small, minor changes to potassium levels in the blood can be life-threatening. What are the causes? This condition may be caused by: Antibiotic medicine. Diarrhea or vomiting. Taking too much of a medicine that helps you have a bowel movement (laxative) can cause diarrhea and lead to hypokalemia. Chronic kidney disease (CKD). Medicines that help the body get rid of excess fluid (diuretics). Eating disorders, such as bulimia. Low magnesium levels in the body. Sweating a lot. What are the signs or symptoms? Symptoms of this condition include: Weakness. Constipation. Fatigue. Muscle cramps. Mental confusion. Skipped heartbeats or irregular heartbeat (palpitations). Tingling or numbness. How is this diagnosed? This condition is diagnosed with a blood test. How is this treated? This condition may be treated by: Taking potassium supplements by mouth. Adjusting the medicines that you take. Eating more foods that contain a lot of potassium. If your potassium level is very low, you may need to get potassium through an IV  and be monitored in the hospital. Follow these instructions at home:  Take over-the-counter and prescription medicines only as told by your health care provider. This includes vitamins and supplements. Eat a healthy diet. A healthy diet includes fresh fruits and vegetables, whole grains, healthy fats, and lean proteins. If instructed, eat more foods that contain a lot of potassium. This includes: Nuts, such as peanuts and pistachios. Seeds, such as sunflower seeds and pumpkin seeds. Peas, lentils, and lima beans. Whole grain and bran cereals and breads. Fresh fruits and vegetables, such as apricots, avocado, bananas, cantaloupe, kiwi, oranges, tomatoes, asparagus, and potatoes. Orange juice. Tomato juice. Red meats. Yogurt. Keep all follow-up visits as told by your health care provider. This is important. Contact a health care provider if you: Have weakness that gets worse. Feel your heart pounding or racing. Vomit. Have diarrhea. Have diabetes (diabetes mellitus) and you have trouble keeping your blood sugar (glucose) in your target range. Get help right away if you: Have chest pain. Have shortness of breath. Have vomiting or diarrhea that lasts for more than 2 days. Faint. Summary Hypokalemia means that the amount of potassium in the blood is lower than normal. This condition is diagnosed with a blood test. Hypokalemia may be treated by taking potassium supplements, adjusting the medicines that you take, or eating more foods that  are high in potassium. If your potassium level is very low, you may need to get potassium through an IV and be monitored in the hospital. This information is not intended to replace advice given to you by your health care provider. Make sure you discuss any questions you have with your health care provider. Document Revised: 09/11/2017 Document Reviewed: 09/12/2017 Elsevier Patient Education  [place].

## 2021-03-30 NOTE — Progress Notes (Signed)
No vb. No lof.  

## 2021-03-30 NOTE — Progress Notes (Signed)
Routine Prenatal Care Visit  Subjective  Rachel Richards is a 26 y.o. G2P1001 at [redacted]w[redacted]d being seen today for ongoing prenatal care.  She is currently monitored for the following issues for this low-risk pregnancy and has Herpes simplex; Vision changes; and Supervision of normal pregnancy on their problem list.  ----------------------------------------------------------------------------------- Patient reports  having blood work done at PCP visit recently with decreased levels of electrolytes. She also had an elevated BP at the same visit and she has noticed some swelling recently in hands and feet. She denies headache or visual changes or epigastric pain. We discussed supplementing of calcium, vitamin D, potassium and sodium. Reviewed PIH precautions .   Contractions: Not present. Vag. Bleeding: None.  Movement: Present. Leaking Fluid denies.  ----------------------------------------------------------------------------------- The following portions of the patient's history were reviewed and updated as appropriate: allergies, current medications, past family history, past medical history, past social history, past surgical history and problem list. Problem list updated.  Objective  Blood pressure 122/70, weight 170 lb (77.1 kg), last menstrual period 08/02/2020. Pregravid weight 154 lb (69.9 kg) Total Weight Gain 16 lb (7.258 kg) Urinalysis: Urine Protein    Urine Glucose    Fetal Status: Fetal Heart Rate (bpm): 133 Fundal Height: 34 cm Movement: Present     General:  Alert, oriented and cooperative. Patient is in no acute distress.  Skin: Skin is warm and dry. No rash noted.   Cardiovascular: Normal heart rate noted  Respiratory: Normal respiratory effort, no problems with respiration noted  Abdomen: Soft, gravid, appropriate for gestational age. Pain/Pressure: Absent     Pelvic:  Cervical exam deferred        Extremities: Normal range of motion.  Edema: None  Mental Status: Normal mood and  affect. Normal behavior. Normal judgment and thought content.   Assessment   26 y.o. G2P1001 at [redacted]w[redacted]d by  05/09/2021, by Last Menstrual Period presenting for routine prenatal visit  Plan   Pregnancy2 Problems (from 10/13/20 to present)     Problem Noted Resolved   Supervision of normal pregnancy 10/13/2020 by Tresea Mall, CNM No   Overview Addendum 03/16/2021 10:36 AM by Tresea Mall, CNM     Nursing Staff Provider  Office Location  [facility] Dating    Language  English Anatomy US    Flu Vaccine   Genetic Screen  NIPS:   TDaP vaccine   06/20/17 Hgb A1C or  GTT Early : NA Third trimester :   Covid    LAB RESULTS   Rhogam   Blood Type     Feeding Plan Breast Antibody    Contraception  Rubella    Circumcision  RPR     Pediatrician   HBsAg     Support Person [name] HIV    Prenatal Classes  Varicella     GBS  (For PCN allergy, check sensitivities)   BTL Consent     VBAC Consent NA Pap 2019 negative, 10/13/20:     Hgb Electro    Pelvis Tested 6#11oz CF      SMA     HSV+ 36 wk PPX [ ]               Preterm labor symptoms and general obstetric precautions including but not limited to vaginal bleeding, contractions, leaking of fluid and fetal movement were reviewed in detail with the patient. Please refer to After Visit Summary for other counseling recommendations.   Return in about 2 weeks (around 04/13/2021) for rob.  06/13/2021, CNM 03/30/2021  2:27 PM

## 2021-04-06 ENCOUNTER — Encounter: Payer: Medicaid Other | Admitting: Advanced Practice Midwife

## 2021-04-13 ENCOUNTER — Other Ambulatory Visit (HOSPITAL_COMMUNITY)
Admission: RE | Admit: 2021-04-13 | Discharge: 2021-04-13 | Disposition: A | Discharge status: Home / Self Care | Payer: Medicaid Other | Source: Ambulatory Visit | Attending: Advanced Practice Midwife | Admitting: Advanced Practice Midwife

## 2021-04-13 ENCOUNTER — Encounter: Payer: Self-pay | Admitting: Advanced Practice Midwife

## 2021-04-13 ENCOUNTER — Ambulatory Visit (INDEPENDENT_AMBULATORY_CARE_PROVIDER_SITE_OTHER): Payer: Medicaid Other | Admitting: Advanced Practice Midwife

## 2021-04-13 ENCOUNTER — Encounter: Payer: Medicaid Other | Admitting: Advanced Practice Midwife

## 2021-04-13 ENCOUNTER — Other Ambulatory Visit: Payer: Self-pay

## 2021-04-13 VITALS — BP 121/80 | Wt 174.0 lb

## 2021-04-13 DIAGNOSIS — Z113 Encounter for screening for infections with a predominantly sexual mode of transmission: Secondary | ICD-10-CM | POA: Insufficient documentation

## 2021-04-13 DIAGNOSIS — Z3483 Encounter for supervision of other normal pregnancy, third trimester: Secondary | ICD-10-CM

## 2021-04-13 DIAGNOSIS — Z369 Encounter for antenatal screening, unspecified: Secondary | ICD-10-CM | POA: Diagnosis present

## 2021-04-13 DIAGNOSIS — Z3685 Encounter for antenatal screening for Streptococcus B: Secondary | ICD-10-CM

## 2021-04-13 DIAGNOSIS — Z3A36 36 weeks gestation of pregnancy: Secondary | ICD-10-CM

## 2021-04-13 DIAGNOSIS — B009 Herpesviral infection, unspecified: Secondary | ICD-10-CM

## 2021-04-13 MED ORDER — VALACYCLOVIR HCL 500 MG PO TABS: 500.0000 mg | ORAL_TABLET | Freq: Two times a day (BID) | ORAL | 6 refills | Status: AC

## 2021-04-13 NOTE — Progress Notes (Signed)
ROB- no concerns 

## 2021-04-13 NOTE — Progress Notes (Signed)
Routine Prenatal Care Visit ? ?Subjective  ?Rachel Richards is a 26 y.o. G2P1001 at [redacted]w[redacted]d being seen today for ongoing prenatal care.  She is currently monitored for the following issues for this low-risk pregnancy and has Herpes simplex; Vision changes; and Supervision of normal pregnancy on their problem list.  ?----------------------------------------------------------------------------------- ?Patient reports left hip pain. Comfort measures reviewed.   ?Contractions: Not present. Vag. Bleeding: None.  Movement: Present. Leaking Fluid denies.  ?----------------------------------------------------------------------------------- ?The following portions of the patient's history were reviewed and updated as appropriate: allergies, current medications, past family history, past medical history, past social history, past surgical history and problem list. Problem list updated. ? ?Objective  ?Blood pressure 121/80, weight 174 lb (78.9 kg), last menstrual period 08/02/2020. ?Pregravid weight 154 lb (69.9 kg) Total Weight Gain 20 lb (9.072 kg) ?Urinalysis: Urine Protein    Urine Glucose   ? ?Fetal Status: Fetal Heart Rate (bpm): 131 Fundal Height: 36 cm Movement: Present  Presentation: Vertex ? ?General:  Alert, oriented and cooperative. Patient is in no acute distress.  ?Skin: Skin is warm and dry. No rash noted.   ?Cardiovascular: Normal heart rate noted  ?Respiratory: Normal respiratory effort, no problems with respiration noted  ?Abdomen: Soft, gravid, appropriate for gestational age. Pain/Pressure: Absent     ?Pelvic:  GBS/aptima collected Dilation: 1.5 Effacement (%): 50 Station: -3  ?Extremities: Normal range of motion.  Edema: None  ?Mental Status: Normal mood and affect. Normal behavior. Normal judgment and thought content.  ? ?Assessment  ? ?26 y.o. G2P1001 at [redacted]w[redacted]d by  05/09/2021, by Last Menstrual Period presenting for routine prenatal visit ? ?Plan  ? ?Pregnancy2 Problems (from 10/13/20 to present)   ?  Problem Noted Resolved  ? Supervision of normal pregnancy 10/13/2020 by Tresea Mall, CNM No  ? Overview Addendum 03/16/2021 10:36 AM by Tresea Mall, CNM  ?   ?Nursing Staff Provider  ?Office Location  [facility] Dating    ?Language  English Anatomy US    ?Flu Vaccine   Genetic Screen  NIPS:   ?TDaP vaccine   06/20/17 Hgb A1C or  ?GTT Early : NA ?Third trimester :   ?Covid    LAB RESULTS   ?Rhogam   Blood Type     ?Feeding Plan Breast Antibody    ?Contraception  Rubella    ?Circumcision  RPR     ?Pediatrician   HBsAg     ?Support Person [name] HIV    ?Prenatal Classes  Varicella   ?  GBS  (For PCN allergy, check sensitivities)   ?BTL Consent     ?VBAC Consent NA Pap 2019 negative, 10/13/20:   ?  Hgb Electro    ?Pelvis Tested 6#11oz CF   ?   SMA   ?  HSV+ 36 wk PPX [ ]   ? ? ?  ?  ?  ?  ? ?Preterm labor symptoms and general obstetric precautions including but not limited to vaginal bleeding, contractions, leaking of fluid and fetal movement were reviewed in detail with the patient. ?Please refer to After Visit Summary for other counseling recommendations.  ? ?Rx valtrex prophylaxis ? ?Return in about 1 week (around 04/20/2021) for rob. ? ?06/20/2021, CNM ?04/13/2021 1:37 PM   ? ?

## 2021-04-14 LAB — CERVICOVAGINAL ANCILLARY ONLY
Chlamydia: NEGATIVE
Comment: NEGATIVE
Comment: NEGATIVE
Comment: NORMAL
Neisseria Gonorrhea: NEGATIVE
Trichomonas: NEGATIVE

## 2021-04-15 LAB — STREP GP B NAA: Strep Gp B NAA: NEGATIVE

## 2021-04-17 ENCOUNTER — Encounter: Payer: Self-pay | Admitting: Advanced Practice Midwife

## 2021-04-20 ENCOUNTER — Other Ambulatory Visit: Payer: Self-pay

## 2021-04-20 ENCOUNTER — Encounter: Payer: Medicaid Other | Admitting: Advanced Practice Midwife

## 2021-04-20 ENCOUNTER — Ambulatory Visit (INDEPENDENT_AMBULATORY_CARE_PROVIDER_SITE_OTHER): Payer: Medicaid Other | Admitting: Advanced Practice Midwife

## 2021-04-20 ENCOUNTER — Encounter: Payer: Self-pay | Admitting: Advanced Practice Midwife

## 2021-04-20 VITALS — BP 119/82 | Wt 176.0 lb

## 2021-04-20 DIAGNOSIS — Z3483 Encounter for supervision of other normal pregnancy, third trimester: Secondary | ICD-10-CM

## 2021-04-20 DIAGNOSIS — Z3A37 37 weeks gestation of pregnancy: Secondary | ICD-10-CM

## 2021-04-20 LAB — POCT URINALYSIS DIPSTICK OB
Glucose, UA: NEGATIVE
POC,PROTEIN,UA: NEGATIVE

## 2021-04-20 NOTE — Progress Notes (Addendum)
Routine Prenatal Care Visit ? ?Subjective  ?Rachel Richards is a 26 y.o. G2P1001 at [redacted]w[redacted]d being seen today for ongoing prenatal care.  She is currently monitored for the following issues for this low-risk pregnancy and has Herpes simplex; Vision changes; and Supervision of normal pregnancy on their problem list.  ?----------------------------------------------------------------------------------- ?Patient reports  an increase in pelvic pressure and left hip pain especially with walking . She was preferring spontaneous labor and now may want to schedule 39w IOL at next visit.  ?Contractions: Not present. Vag. Bleeding: None.  Movement: Present. Leaking Fluid denies.  ?----------------------------------------------------------------------------------- ?The following portions of the patient's history were reviewed and updated as appropriate: allergies, current medications, past family history, past medical history, past social history, past surgical history and problem list. Problem list updated. ? ?Objective  ?Blood pressure 119/82, weight 176 lb (79.8 kg), last menstrual period 08/02/2020. ?Pregravid weight 154 lb (69.9 kg) Total Weight Gain 22 lb (9.979 kg) ?Urinalysis: Urine Protein Negative  Urine Glucose Negative ? ?Fetal Status: Fetal Heart Rate (bpm): 139 Fundal Height: 37 cm Movement: Present  Presentation: Vertex ? ?General:  Alert, oriented and cooperative. Patient is in no acute distress.  ?Skin: Skin is warm and dry. No rash noted.   ?Cardiovascular: Normal heart rate noted  ?Respiratory: Normal respiratory effort, no problems with respiration noted  ?Abdomen: Soft, gravid, appropriate for gestational age. Pain/Pressure: Present     ?Pelvic:  Cervical exam performed Dilation: 2 Effacement (%): 60 Station: -2  ?Extremities: Normal range of motion.  Edema: None  ?Mental Status: Normal mood and affect. Normal behavior. Normal judgment and thought content.  ? ?Assessment  ? ?26 y.o. G2P1001 at [redacted]w[redacted]d by   05/09/2021, by Last Menstrual Period presenting for routine prenatal visit ? ?Plan  ? ?Pregnancy2 Problems (from 10/13/20 to present)   ? ? Problem Noted Resolved  ? Supervision of normal pregnancy 10/13/2020 by Tresea Mall, CNM No  ? Overview Addendum 03/16/2021 10:36 AM by Tresea Mall, CNM  ?   ?Nursing Staff Provider  ?Office Location  [facility] Dating    ?Language  English Anatomy US    ?Flu Vaccine   Genetic Screen  NIPS:   ?TDaP vaccine   06/20/17 Hgb A1C or  ?GTT Early : NA ?Third trimester :   ?Covid    LAB RESULTS   ?Rhogam   Blood Type     ?Feeding Plan Breast Antibody    ?Contraception BTL Rubella    ?Circumcision  RPR     ?Pediatrician   HBsAg     ?Support Person [name] HIV    ?Prenatal Classes  Varicella   ?  GBS  (For PCN allergy, check sensitivities)   ?BTL Consent Signed 04/20/21 interval tubal    ?VBAC Consent NA Pap 2019 negative, 10/13/20:   ?  Hgb Electro    ?Pelvis Tested 6#11oz CF   ?   SMA   ?  HSV+ 36 wk PPX [ ]   ? ? ?  ?  ? ?  ?  ? ?Term labor symptoms and general obstetric precautions including but not limited to vaginal bleeding, contractions, leaking of fluid and fetal movement were reviewed in detail with the patient. ?Please refer to After Visit Summary for other counseling recommendations.  ?Tubal consent signed today ? ?Return in about 1 week (around 04/27/2021) for rob. ? ?04/29/2021, CNM ?04/20/2021 1:32 PM   ? ?

## 2021-04-21 ENCOUNTER — Encounter: Payer: Medicaid Other | Admitting: Obstetrics and Gynecology

## 2021-04-25 ENCOUNTER — Encounter: Payer: Self-pay | Admitting: Advanced Practice Midwife

## 2021-04-27 ENCOUNTER — Ambulatory Visit (INDEPENDENT_AMBULATORY_CARE_PROVIDER_SITE_OTHER): Payer: Medicaid Other | Admitting: Advanced Practice Midwife

## 2021-04-27 ENCOUNTER — Other Ambulatory Visit: Payer: Self-pay

## 2021-04-27 VITALS — BP 127/79 | Wt 178.0 lb

## 2021-04-27 DIAGNOSIS — Z3A38 38 weeks gestation of pregnancy: Secondary | ICD-10-CM

## 2021-04-27 DIAGNOSIS — Z3483 Encounter for supervision of other normal pregnancy, third trimester: Secondary | ICD-10-CM

## 2021-04-27 LAB — POCT URINALYSIS DIPSTICK OB
Glucose, UA: NEGATIVE
POC,PROTEIN,UA: NEGATIVE

## 2021-04-27 NOTE — H&P (Signed)
OB History & Physical  ? ?History of Present Illness:  ?Initial H&P: 04/27/2021 ? ?Chief Complaint: planned elective induction ? ?HPI:  ?Rachel Richards is a 26 y.o. G34P1001 female at [redacted]w[redacted]d dated by LMP.  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   ? ?She denies contractions.   She denies leakage of fluid.   She denies vaginal bleeding.   She reports fetal movement.   ? ?Total weight gain for pregnancy: 24 lb (10.9 kg)  ? ?Obstetrical Problem List: ?Pregnancy2 Problems (from 10/13/20 to present)   ? ? Problem Noted Resolved  ? Supervision of normal pregnancy 10/13/2020 by Tresea Mall, CNM No  ? Overview Addendum 04/20/2021  2:57 PM by Tresea Mall, CNM  ?   ?Nursing Staff Provider  ?Office Location  [facility] Dating    ?Language  English Anatomy US    ?Flu Vaccine   Genetic Screen  NIPS:   ?TDaP vaccine   06/20/17 Hgb A1C or  ?GTT Early : NA ?Third trimester :   ?Covid    LAB RESULTS   ?Rhogam   Blood Type   O positive  ?Feeding Plan Breast Antibody  negative  ?Contraception BTL Rubella  immune  ?Circumcision  RPR  Non-reactive  ?Pediatrician   HBsAg  negative  ?Support Person Rachel Richards HIV  negative  ?Prenatal Classes  Varicella  immune  ?  GBS  (For PCN allergy, check sensitivities) negative  ?BTL Consent Signed 04/20/21 interval tubal    ?VBAC Consent NA Pap 2019 negative, 10/13/20:   ?  Hgb Electro    ?Pelvis Tested 6#11oz CF   ?   SMA   ?  HSV+ 36 wk PPX [X]   ? ?  ?  ? ?  ?  ? ?Maternal Medical History:  ? ?Past Medical History:  ?Diagnosis Date  ? Chronic thoracic back pain   ? Depression   ? Marijuana use   ? ? ?Past Surgical History:  ?Procedure Laterality Date  ? HAND SURGERY Right 2016  ? repair broken hand  ? WISDOM TOOTH EXTRACTION  2014  ? all four  ? ? ?No Known Allergies ? ?Prior to Admission medications   ?Medication Sig Start Date End Date Taking? Authorizing Provider  ?Cholecalciferol (VITAMIN D-3) 25 MCG (1000 UT) CAPS    Yes [provider]   ?Prenatal Vit-Fe Fumarate-FA (PRENATAL VITAMIN PO) Take 1 tablet by mouth daily.   Yes [provider]  ?valACYclovir (VALTREX) 500 MG tablet Take 1 tablet (500 mg total) by mouth 2 (two) times daily. 04/13/21  Yes 06/13/21, CNM  ?medroxyPROGESTERone (DEPO-PROVERA) 150 MG/ML injection Inject 1 mL (150 mg total) into the muscle every 3 (three) months. 09/12/17 07/08/20  07/10/20, CNM  ? ? ?OB History  ?Gravida Para Term Preterm AB Living  ?2 1 1  0 0 1  ?SAB IAB Ectopic Multiple Live Births  ?0 0 0 0 1  ?  ?# Outcome Date GA Lbr Len/2nd Weight Sex Delivery Anes PTL Lv  ?2 Current           ?1 Term 08/18/17 [redacted]w[redacted]d / 01:06 6 lb 11.2 oz (3.04 kg) F Vag-Spont EPI  LIV  ? ? ?Prenatal care site: [facility] ? ?Social History: She  reports that she has never smoked. She has never used smokeless tobacco. She reports current alcohol use. She reports that she does not currently use drugs after having used the following drugs: Marijuana. ? ?Family History:  family history includes Colon cancer (age of onset: 27) in her maternal uncle; Healthy in her father; Other in her mother.  ? ? ?Review of Systems:  ?Review of Systems  ?Constitutional:  Negative for chills and fever.  ?HENT:  Negative for congestion, ear discharge, ear pain, hearing loss, sinus pain and sore throat.   ?Eyes:  Negative for blurred vision and double vision.  ?Respiratory:  Negative for cough, shortness of breath and wheezing.   ?Cardiovascular:  Negative for chest pain, palpitations and leg swelling.  ?Gastrointestinal:  Negative for abdominal pain, blood in stool, constipation, diarrhea, heartburn, melena, nausea and vomiting.  ?Genitourinary:  Negative for dysuria, flank pain, frequency, hematuria and urgency.  ?Musculoskeletal:  Negative for back pain, joint pain and myalgias.  ?Skin:  Negative for itching and rash.  ?Neurological:  Negative for dizziness, tingling, tremors, sensory change, speech change, focal weakness, seizures,  loss of consciousness, weakness and headaches.  ?Endo/Heme/Allergies:  Negative for environmental allergies. Does not bruise/bleed easily.  ?Psychiatric/Behavioral:  Negative for depression, hallucinations, memory loss, substance abuse and suicidal ideas. The patient is not nervous/anxious and does not have insomnia.    ? ?Physical Exam:  ?BP 127/79   Wt 178 lb (80.7 kg)   LMP 08/02/2020 (Within Days) Comment: normal  BMI 32.56 kg/m?   ?Constitutional: Well nourished, well developed female in no acute distress.  ?HEENT: normal ?Skin: Warm and dry.  ?Cardiovascular: Regular rate and rhythm.   ?Extremity:  no edema   ?Respiratory: Clear to auscultation bilateral. Normal respiratory effort ?Abdomen: FHT present ?Back: no CVAT ?Neuro: DTRs 2+, Cranial nerves grossly intact ?Psych: Alert and Oriented x3. No memory deficits. Normal mood and affect.  ?MS: normal gait, normal bilateral lower extremity ROM/strength/stability. ? ?Pelvic exam:  ?is not limited by body habitus ?EGBUS: within normal limits ?Vagina: within normal limits and with normal mucosa  ?Cervix: 2/60-70/-2 ? ? ?Lab Results  ?Component Value Date  ? SARSCOV2NAA NEGATIVE 03/07/2020  ?Covid test pending ? ?Assessment:  ?Rachel Richards is a 26 y.o. G41P1001 female at [redacted]w[redacted]d with elective induction of labor.  ? ?Plan:  ?Admit to Labor & Delivery  ?CBC, T&S, Clrs, IVF ?GBS negative.   ?Fetal well-being: reassuring ?Begin induction based on admission exam ?  ? ?Tresea Mall, CNM ?04/27/2021 3:13 PM  ?  ?

## 2021-04-27 NOTE — Progress Notes (Signed)
Routine Prenatal Care Visit ? ?Subjective  ?Rachel Richards is a 26 y.o. G2P1001 at [redacted]w[redacted]d being seen today for ongoing prenatal care.  She is currently monitored for the following issues for this low-risk pregnancy and has Herpes simplex; Vision changes; and Supervision of normal pregnancy on their problem list.  ?----------------------------------------------------------------------------------- ?Patient reports no complaints.   ?Contractions: Not present. Vag. Bleeding: None.  Movement: Present. Leaking Fluid denies.  ?----------------------------------------------------------------------------------- ?The following portions of the patient's history were reviewed and updated as appropriate: allergies, current medications, past family history, past medical history, past social history, past surgical history and problem list. Problem list updated. ? ?Objective  ?Blood pressure 127/79, weight 178 lb (80.7 kg), last menstrual period 08/02/2020. ?Pregravid weight 154 lb (69.9 kg) Total Weight Gain 24 lb (10.9 kg) ?Urinalysis: Urine Protein Negative  Urine Glucose Negative ? ?Fetal Status: Fetal Heart Rate (bpm): 134 Fundal Height: 38 cm Movement: Present  Presentation: Vertex ? ?General:  Alert, oriented and cooperative. Patient is in no acute distress.  ?Skin: Skin is warm and dry. No rash noted.   ?Cardiovascular: Normal heart rate noted  ?Respiratory: Normal respiratory effort, no problems with respiration noted  ?Abdomen: Soft, gravid, appropriate for gestational age. Pain/Pressure: Absent     ?Pelvic:  Cervical exam performed Dilation: 2 Effacement (%): 60, 70 Station: -2, cervical sweep  ?Extremities: Normal range of motion.  Edema: None  ?Mental Status: Normal mood and affect. Normal behavior. Normal judgment and thought content.  ? ?Assessment  ? ?26 y.o. G2P1001 at 100w2d by  05/09/2021, by Last Menstrual Period presenting for routine prenatal visit ? ?Plan  ? ?Pregnancy2 Problems (from 10/13/20 to present)    ? ? Problem Noted Resolved  ? Supervision of normal pregnancy 10/13/2020 by Tresea Mall, CNM No  ? Overview Addendum 04/20/2021  2:57 PM by Tresea Mall, CNM  ?   ?Nursing Staff Provider  ?Office Location  [facility] Dating    ?Language  English Anatomy US    ?Flu Vaccine   Genetic Screen  NIPS:   ?TDaP vaccine   06/20/17 Hgb A1C or  ?GTT Early : NA ?Third trimester :   ?Covid    LAB RESULTS   ?Rhogam   Blood Type     ?Feeding Plan Breast Antibody    ?Contraception BTL Rubella    ?Circumcision  RPR     ?Pediatrician   HBsAg     ?Support Person Rachel Richards HIV    ?Prenatal Classes  Varicella   ?  GBS  (For PCN allergy, check sensitivities) negative  ?BTL Consent Signed 04/20/21 interval tubal    ?VBAC Consent NA Pap 2019 negative, 10/13/20:   ?  Hgb Electro    ?Pelvis Tested 6#11oz CF   ?   SMA   ?  HSV+ 36 wk PPX [ ]   ? ? ?  ?  ? ?  ?  ? ?Term labor symptoms and general obstetric precautions including but not limited to vaginal bleeding, contractions, leaking of fluid and fetal movement were reviewed in detail with the patient. ?Please refer to After Visit Summary for other counseling recommendations.  ? ?Return in about 1 week (around 05/04/2021) for rob. ? ?IOL 3/25 at 5 am ?IOL form faxed to L&D ?Covid testing form given to patient ?Hospital orders placed ?Initial H&P done ? ?4/25, CNM ?04/27/2021 1:43 PM   ? ?

## 2021-04-29 ENCOUNTER — Encounter: Payer: Self-pay | Admitting: Obstetrics & Gynecology

## 2021-04-29 ENCOUNTER — Inpatient Hospital Stay: Payer: Medicaid Other | Admitting: Anesthesiology

## 2021-04-29 ENCOUNTER — Other Ambulatory Visit: Payer: Self-pay

## 2021-04-29 ENCOUNTER — Inpatient Hospital Stay
Admission: EM | Admit: 2021-04-29 | Discharge: 2021-05-01 | DRG: 806 | Disposition: A | Discharge status: Home / Self Care | Payer: Medicaid Other | Attending: Obstetrics & Gynecology | Admitting: Obstetrics & Gynecology

## 2021-04-29 ENCOUNTER — Encounter: Payer: Self-pay | Admitting: Advanced Practice Midwife

## 2021-04-29 DIAGNOSIS — B009 Herpesviral infection, unspecified: Secondary | ICD-10-CM | POA: Diagnosis not present

## 2021-04-29 DIAGNOSIS — Z3483 Encounter for supervision of other normal pregnancy, third trimester: Principal | ICD-10-CM

## 2021-04-29 DIAGNOSIS — O98313 Other infections with a predominantly sexual mode of transmission complicating pregnancy, third trimester: Secondary | ICD-10-CM | POA: Diagnosis not present

## 2021-04-29 DIAGNOSIS — Z3A38 38 weeks gestation of pregnancy: Secondary | ICD-10-CM

## 2021-04-29 DIAGNOSIS — O9832 Other infections with a predominantly sexual mode of transmission complicating childbirth: Secondary | ICD-10-CM | POA: Diagnosis present

## 2021-04-29 DIAGNOSIS — Z3490 Encounter for supervision of normal pregnancy, unspecified, unspecified trimester: Secondary | ICD-10-CM | POA: Diagnosis present

## 2021-04-29 DIAGNOSIS — O322XX0 Maternal care for transverse and oblique lie, not applicable or unspecified: Secondary | ICD-10-CM | POA: Diagnosis present

## 2021-04-29 DIAGNOSIS — A6000 Herpesviral infection of urogenital system, unspecified: Secondary | ICD-10-CM | POA: Diagnosis present

## 2021-04-29 DIAGNOSIS — O26893 Other specified pregnancy related conditions, third trimester: Secondary | ICD-10-CM | POA: Diagnosis present

## 2021-04-29 LAB — CBC
HCT: 41.7 % (ref 36.0–46.0)
Hemoglobin: 14 g/dL (ref 12.0–15.0)
MCH: 29.5 pg (ref 26.0–34.0)
MCHC: 33.6 g/dL (ref 30.0–36.0)
MCV: 88 fL (ref 80.0–100.0)
Platelets: 244 10*3/uL (ref 150–400)
RBC: 4.74 MIL/uL (ref 3.87–5.11)
RDW: 13.8 % (ref 11.5–15.5)
WBC: 11.8 10*3/uL — ABNORMAL HIGH (ref 4.0–10.5)
nRBC: 0 % (ref 0.0–0.2)

## 2021-04-29 LAB — COMPREHENSIVE METABOLIC PANEL
ALT: 13 U/L (ref 0–44)
AST: 19 U/L (ref 15–41)
Albumin: 3.3 g/dL — ABNORMAL LOW (ref 3.5–5.0)
Alkaline Phosphatase: 157 U/L — ABNORMAL HIGH (ref 38–126)
Anion gap: 14 (ref 5–15)
BUN: 9 mg/dL (ref 6–20)
CO2: 21 mmol/L — ABNORMAL LOW (ref 22–32)
Calcium: 9.1 mg/dL (ref 8.9–10.3)
Chloride: 102 mmol/L (ref 98–111)
Creatinine, Ser: 0.46 mg/dL (ref 0.44–1.00)
GFR, Estimated: >60 mL/min (ref >60)
Glucose, Bld: 63 mg/dL — ABNORMAL LOW (ref 70–99)
Potassium: 4 mmol/L (ref 3.5–5.1)
Sodium: 137 mmol/L (ref 135–145)
Total Bilirubin: 0.4 mg/dL (ref 0.3–1.2)
Total Protein: 7 g/dL (ref 6.5–8.1)

## 2021-04-29 LAB — TYPE AND SCREEN
ABO/RH(D): O POS
Antibody Screen: NEGATIVE

## 2021-04-29 MED ORDER — LACTATED RINGERS IV SOLN: INTRAVENOUS | Status: DC

## 2021-04-29 MED ORDER — ONDANSETRON HCL 4 MG/2ML IJ SOLN: 4.0000 mg | Freq: Four times a day (QID) | INTRAMUSCULAR | Status: DC | PRN

## 2021-04-29 MED ORDER — PHENYLEPHRINE 40 MCG/ML (10ML) SYRINGE FOR IV PUSH (FOR BLOOD PRESSURE SUPPORT)
80.0000 ug | PREFILLED_SYRINGE | INTRAVENOUS | Status: DC | PRN
  Filled 2021-04-29: qty 10

## 2021-04-29 MED ORDER — EPHEDRINE 5 MG/ML INJ
10.0000 mg | INTRAVENOUS | Status: DC | PRN
  Filled 2021-04-29: qty 2

## 2021-04-29 MED ORDER — SODIUM CHLORIDE 0.9 % IV SOLN
INTRAVENOUS | Status: DC | PRN
  Administered 2021-04-29: 10 mL via EPIDURAL

## 2021-04-29 MED ORDER — BUTORPHANOL TARTRATE 1 MG/ML IJ SOLN: 1.0000 mg | INTRAMUSCULAR | Status: DC | PRN

## 2021-04-29 MED ORDER — OXYTOCIN-SODIUM CHLORIDE 30-0.9 UT/500ML-% IV SOLN
2.5000 [IU]/h | INTRAVENOUS | Status: DC
  Administered 2021-04-29: 2.5 [IU]/h via INTRAVENOUS
  Filled 2021-04-29: qty 500

## 2021-04-29 MED ORDER — LACTATED RINGERS IV SOLN: 500.0000 mL | Freq: Once | INTRAVENOUS | Status: DC

## 2021-04-29 MED ORDER — LACTATED RINGERS IV SOLN: 500.0000 mL | INTRAVENOUS | Status: DC | PRN

## 2021-04-29 MED ORDER — OXYTOCIN BOLUS FROM INFUSION
333.0000 mL | Freq: Once | INTRAVENOUS | Status: AC
  Administered 2021-04-29: 333 mL via INTRAVENOUS

## 2021-04-29 MED ORDER — LIDOCAINE HCL (PF) 1 % IJ SOLN
INTRAMUSCULAR | Status: DC | PRN
  Administered 2021-04-29: 3 mL via SUBCUTANEOUS

## 2021-04-29 MED ORDER — LIDOCAINE HCL (PF) 1 % IJ SOLN
30.0000 mL | INTRAMUSCULAR | Status: DC | PRN
  Filled 2021-04-29: qty 30

## 2021-04-29 MED ORDER — AMMONIA AROMATIC IN INHA
RESPIRATORY_TRACT | Status: AC
  Filled 2021-04-29: qty 10

## 2021-04-29 MED ORDER — ACETAMINOPHEN 325 MG PO TABS: 650.0000 mg | ORAL_TABLET | ORAL | Status: DC | PRN

## 2021-04-29 MED ORDER — OXYTOCIN 10 UNIT/ML IJ SOLN
INTRAMUSCULAR | Status: AC
  Filled 2021-04-29: qty 2

## 2021-04-29 MED ORDER — MISOPROSTOL 200 MCG PO TABS
ORAL_TABLET | ORAL | Status: AC
  Filled 2021-04-29: qty 4

## 2021-04-29 MED ORDER — EPHEDRINE 5 MG/ML INJ
10.0000 mg | INTRAVENOUS | Status: DC | PRN
Start: 2021-04-29 — End: 2021-04-30
  Filled 2021-04-29: qty 2

## 2021-04-29 MED ORDER — DIPHENHYDRAMINE HCL 50 MG/ML IJ SOLN: 12.5000 mg | INTRAMUSCULAR | Status: DC | PRN

## 2021-04-29 MED ORDER — LIDOCAINE-EPINEPHRINE (PF) 1.5 %-1:200000 IJ SOLN
INTRAMUSCULAR | Status: DC | PRN
  Administered 2021-04-29: 3 mL via EPIDURAL

## 2021-04-29 MED ORDER — FENTANYL-BUPIVACAINE-NACL 0.5-0.125-0.9 MG/250ML-% EP SOLN
12.0000 mL/h | EPIDURAL | Status: DC | PRN
  Administered 2021-04-29: 12 mL/h via EPIDURAL

## 2021-04-29 MED ORDER — FENTANYL-BUPIVACAINE-NACL 0.5-0.125-0.9 MG/250ML-% EP SOLN
EPIDURAL | Status: AC
  Filled 2021-04-29: qty 250

## 2021-04-29 MED ORDER — TERBUTALINE SULFATE 1 MG/ML IJ SOLN: 0.2500 mg | Freq: Once | INTRAMUSCULAR | Status: DC | PRN

## 2021-04-29 NOTE — Anesthesia Preprocedure Evaluation (Signed)
Anesthesia Evaluation  ?Patient identified by MRN, date of birth, ID band ?Patient awake ? ? ? ?Reviewed: ?Allergy & Precautions, NPO status , Patient's Chart, lab work & pertinent test results ? ?History of Anesthesia Complications ?Negative for: history of anesthetic complications ? ?Airway ?Mallampati: II ? ?TM Distance: >3 FB ?Neck ROM: Full ? ? ? Dental ?no notable dental hx. ?(+) Teeth Intact ?  ?Pulmonary ?neg pulmonary ROS, neg sleep apnea, neg COPD, Patient abstained from smoking.Not current smoker,  ?  ?Pulmonary exam normal ?breath sounds clear to auscultation ? ? ? ? ? ? Cardiovascular ?Exercise Tolerance: Good ?METS(-) hypertension(-) CAD and (-) Past MI negative cardio ROS ? ?(-) dysrhythmias  ?Rhythm:Regular Rate:Normal ?- Systolic murmurs ? ?  ?Neuro/Psych ?PSYCHIATRIC DISORDERS Depression negative neurological ROS ?   ? GI/Hepatic ?neg GERD  ,(+)  ?  ? (-) substance abuse ? ,   ?Endo/Other  ?neg diabetes ? Renal/GU ?negative Renal ROS  ? ?  ?Musculoskeletal ? ? Abdominal ?  ?Peds ? Hematology ?  ?Anesthesia Other Findings ?Past Medical History: ?No date: Chronic thoracic back pain ?No date: Depression ?No date: Marijuana use ? Reproductive/Obstetrics ?(+) Pregnancy ? ?  ? ? ? ? ? ? ? ? ? ? ? ? ? ?  ?  ? ? ? ? ? ? ? ? ?Anesthesia Physical ?Anesthesia Plan ? ?ASA: 2 ? ?Anesthesia Plan: Epidural  ? ?Post-op Pain Management:   ? ?Induction:  ? ?PONV Risk Score and Plan: 2 and Treatment may vary due to age or medical condition and Ondansetron ? ?Airway Management Planned: Natural Airway ? ?Additional Equipment:  ? ?Intra-op Plan:  ? ?Post-operative Plan:  ? ?Informed Consent: I have reviewed the patients History and Physical, chart, labs and discussed the procedure including the risks, benefits and alternatives for the proposed anesthesia with the patient or authorized representative who has indicated his/her understanding and acceptance.  ? ? ? ? ? ?Plan Discussed with:  Surgeon ? ?Anesthesia Plan Comments: (Discussed R/B/A of neuraxial anesthesia technique with patient: ?- rare risks of spinal/epidural hematoma, nerve damage, infection ?- Risk of PDPH ?- Risk of itching ?- Risk of nausea and vomiting ?- Risk of poor block necessitating replacement of epidural. ?- Risk of allergic reactions. ?Patient voiced understanding.)  ? ? ? ? ? ? ?Anesthesia Quick Evaluation ? ?

## 2021-04-29 NOTE — H&P (Signed)
OB History & Physical  ? ?History of Present Illness:  ?Chief Complaint: contractions ? ?HPI:  ?Rachel Richards is a 26 y.o. G45P1001 female at [redacted]w[redacted]d dated by LMP.  Her pregnancy has been complicated by hsv infection on suppression/no lesions .   ? ?She reports contractions beginning around 7 am this morning and becoming regular around 1 pm.   She denies leakage of fluid.   She denies vaginal bleeding.   She reports fetal movement.   ? ?Total weight gain for pregnancy: 10.9 kg  ? ?Obstetrical Problem List: ?Pregnancy2 Problems (from 10/13/20 to present)   ? ? Problem Noted Resolved  ? Supervision of normal pregnancy 10/13/2020 by Tresea Mall, CNM No  ? Overview Addendum 04/20/2021  2:57 PM by Tresea Mall, CNM  ?   ?Nursing Staff Provider  ?Office Location  [facility] Dating    ?Language  English Anatomy US    ?Flu Vaccine   Genetic Screen  NIPS:   ?TDaP vaccine   06/20/17 Hgb A1C or  ?GTT Early : NA ?Third trimester :   ?Covid    LAB RESULTS   ?Rhogam   Blood Type     ?Feeding Plan Breast Antibody    ?Contraception BTL Rubella    ?Circumcision  RPR     ?Pediatrician   HBsAg     ?Support Person [name] HIV    ?Prenatal Classes  Varicella   ?  GBS  (For PCN allergy, check sensitivities)   ?BTL Consent Signed 04/20/21 interval tubal    ?VBAC Consent NA Pap 2019 negative, 10/13/20:   ?  Hgb Electro    ?Pelvis Tested 6#11oz CF   ?   SMA   ?  HSV+ 36 wk PPX [ ]   ? ?  ?  ? ?  ?  ? ?Maternal Medical History:  ? ?Past Medical History:  ?Diagnosis Date  ? Chronic thoracic back pain   ? Depression   ? Marijuana use   ? ? ?Past Surgical History:  ?Procedure Laterality Date  ? HAND SURGERY Right 2016  ? repair broken hand  ? WISDOM TOOTH EXTRACTION  2014  ? all four  ? ? ?No Known Allergies ? ?Prior to Admission medications   ?Medication Sig Start Date End Date Taking? Authorizing Provider  ?Cholecalciferol (VITAMIN D-3) 25 MCG (1000 UT) CAPS    Yes [provider]  ?Prenatal Vit-Fe Fumarate-FA (PRENATAL VITAMIN PO) Take 1  tablet by mouth daily.   Yes [provider]  ?valACYclovir (VALTREX) 500 MG tablet Take 1 tablet (500 mg total) by mouth 2 (two) times daily. 04/13/21  Yes 06/13/21, CNM  ?medroxyPROGESTERone (DEPO-PROVERA) 150 MG/ML injection Inject 1 mL (150 mg total) into the muscle every 3 (three) months. 09/12/17 07/08/20  07/10/20, CNM  ? ? ?OB History  ?Gravida Para Term Preterm AB Living  ?2 1 1  0 0 1  ?SAB IAB Ectopic Multiple Live Births  ?0 0 0 0 1  ?  ?# Outcome Date GA Lbr Len/2nd Weight Sex Delivery Anes PTL Lv  ?2 Current           ?1 Term 08/18/17 [redacted]w[redacted]d / 01:06 3040 g F Vag-Spont EPI  LIV  ? ? ?Prenatal care site: [facility] ? ?Social History: She  reports that she has never smoked. She has never used smokeless tobacco. She reports current alcohol use. She reports that she does not currently use drugs after having used the following drugs: Marijuana. ? ?Family History:  family history includes Colon cancer (age of onset: 29) in her maternal uncle; Healthy in her father; Other in her mother.  ? ? ?Review of Systems:  ?Review of Systems  ?Constitutional:  Negative for chills and fever.  ?HENT:  Negative for congestion, ear discharge, ear pain, hearing loss, sinus pain and sore throat.   ?Eyes:  Negative for blurred vision and double vision.  ?Respiratory:  Negative for cough, shortness of breath and wheezing.   ?Cardiovascular:  Negative for chest pain, palpitations and leg swelling.  ?Gastrointestinal:  Positive for abdominal pain. Negative for blood in stool, constipation, diarrhea, heartburn, melena, nausea and vomiting.  ?Genitourinary:  Negative for dysuria, flank pain, frequency, hematuria and urgency.  ?Musculoskeletal:  Positive for back pain. Negative for joint pain and myalgias.  ?Skin:  Negative for itching and rash.  ?Neurological:  Negative for dizziness, tingling, tremors, sensory change, speech change, focal weakness, seizures, loss of consciousness, weakness and headaches.   ?Endo/Heme/Allergies:  Negative for environmental allergies. Does not bruise/bleed easily.  ?Psychiatric/Behavioral:  Negative for depression, hallucinations, memory loss, substance abuse and suicidal ideas. The patient is not nervous/anxious and does not have insomnia.    ? ?Physical Exam:  ?Temp 98 ?F (36.7 ?C)   LMP 08/02/2020 (Within Days) Comment: normal  ?Constitutional: Well nourished, well developed female in no acute distress.  ?HEENT: normal ?Skin: Warm and dry.  ?Cardiovascular: Regular rate and rhythm.   ?Extremity:  no edema   ?Respiratory: Clear to auscultation bilateral. Normal respiratory effort ?Abdomen: FHT present ?Back: no CVAT ?Neuro: DTRs 2+, Cranial nerves grossly intact ?Psych: Alert and Oriented x3. No memory deficits. Normal mood and affect.  ?MS: normal gait, normal bilateral lower extremity ROM/strength/stability. ? ?Pelvic exam: (female chaperone present) ?is not limited by body habitus ?EGBUS: within normal limits ?Vagina: within normal limits and with normal mucosa  ?Cervix: 4/80/-2 ?2/60-2 in the office 2 days ago ? ? ?Baseline FHR: 125 beats/min   Variability: moderate   Accelerations: present   Decelerations: absent ?Contractions: present frequency: every 2-5 ?Overall assessment: reassuring ? ? ?Lab Results  ?Component Value Date  ? SARSCOV2NAA NEGATIVE 03/07/2020  ? ? ?Assessment:  ?Rachel Richards is a 26 y.o. G18P1001 female at [redacted]w[redacted]d with active labor.  ? ?Plan:  ?Admit to Labor & Delivery  ?CBC, T&S, Clrs, IVF ?GBS negative.   ?Fetal well-being: Category I ?Expectant management for vaginal delivery ?  ? ?Tresea Mall, CNM ?04/29/2021 4:42 PM  ?  ?

## 2021-04-29 NOTE — Discharge Summary (Signed)
?OB Discharge Summary  ?   ?Patient Name: Rachel Richards ?DOB: 1995/05/27 ?MRN: [id number] ? ?Date of admission: 04/29/2021 ?Delivering provider: Rod Can, CNM  ?Date of Delivery: 04/29/2021  ?Date of discharge: 05/01/2021 ? ?Admitting diagnosis: contractions ?Intrauterine pregnancy: [redacted]w[redacted]d     ?Secondary diagnosis: None ?    ?Discharge diagnosis: Term Pregnancy Delivered                                  ?                                                              ?Post partum procedures: none ? ?Augmentation: N/A ? ?Complications: None ? ?Hospital course:  Onset of Labor With Vaginal Delivery      ?26 y.o. yo G2P1001 at [redacted]w[redacted]d was admitted in Active Labor on 04/29/2021. Patient had an uncomplicated labor course as follows:  ?Membrane Rupture Time/Date: 7:36 PM ,04/29/2021   ?Delivery Method:Vaginal, Spontaneous  ?Episiotomy: None  ?Lacerations:  2nd degree  ?See delivery note for details ? ?Patient had an uncomplicated postpartum course.  She is tolerating a regular diet, her pain is controlled with PO medication, she is ambulating and voiding without difficulty.  ? ?Patient is discharged home in stable condition on 05/01/21. ? ?Newborn Data: ?Birth date:04/[birth year]/2021  ?Birth time:8:40 PM  ?Gender:Female  ?Living status:Living  ?Apgars:9 ,9  ?Weight:3730 g  ? ?Physical exam  ?Vitals:  ? 04/30/21 1518 04/30/21 1951 04/30/21 2349 05/01/21 0830  ?BP: 126/90 132/82 114/73 123/72  ?Pulse: 79 90 86 86  ?Resp: 16 18 18 15   ?Temp: 97.9 ?F (36.6 ?C) 98.5 ?F (36.9 ?C) 98.6 ?F (37 ?C) 97.7 ?F (36.5 ?C)  ?TempSrc: Oral Oral Oral Oral  ?SpO2: 99% 100% 100% 99%  ?Weight:      ?Height:      ? ?General: alert, cooperative, and no distress ?Lochia: appropriate ?Uterine Fundus: firm ?Incision: N/A ?DVT Evaluation: No evidence of DVT seen on physical exam. ? ?Labs: ?Lab Results  ?Component Value Date  ? WBC 10.3 04/30/2021  ? HGB 10.9 (L) 04/30/2021  ? HCT 32.1 (L) 04/30/2021  ? MCV 88.2 04/30/2021  ? PLT 205 04/30/2021  ? ? ?Discharge  instruction: per After Visit Summary. ? ?Medications:  ?Allergies as of 05/01/2021   ?No Known Allergies ?  ? ?  ?Medication List  ?  ? ?TAKE these medications   ? ?PRENATAL VITAMIN PO ?Take 1 tablet by mouth daily. ?  ?valACYclovir 500 MG tablet ?Commonly known as: VALTREX ?Take 1 tablet (500 mg total) by mouth 2 (two) times daily. ?  ?Vitamin D-3 25 MCG (1000 UT) Caps ?  ? ?  ? ? ?Diet: routine diet ? ?Activity: Advance as tolerated. Pelvic rest for 6 weeks.  ? ?Outpatient follow up: ? Follow-up Information   ? ? Rod Can, CNM. Schedule an appointment as soon as possible for a visit in 3 week(s).   ?Specialty: Obstetrics ?Why: tubal planning visit ?Contact information: ?[address] ?[place] [address] ?[phone] ? ? ?  ?  ? ?  ?  ? ?  ?   ? ?Postpartum contraception: Tubal Ligation Interval ?Rhogam Given postpartum: Rh positive ?Rubella vaccine given postpartum: immune ?Varicella vaccine  given postpartum: immune ?TDaP given antepartum or postpartum: yes ? ? ? ?Newborn Delivery   ?Birth date/time: 04/29/2021 20:40:00 ?Delivery type: Vaginal, Spontaneous ?  ?  ? ? ?Baby Feeding: Breast ? ?Disposition:home with mother ? ?SIGNED: ? ?Hoyt Koch, MD ?05/01/2021 10:29 AM   ?

## 2021-04-29 NOTE — Anesthesia Procedure Notes (Signed)
Epidural ?Patient location during procedure: OB ?Start time: 04/29/2021 7:36 PM ?End time: 04/29/2021 7:57 PM ? ?Staffing ?Anesthesiologist: Corinda Gubler, MD ?Performed: anesthesiologist  ? ?Preanesthetic Checklist ?Completed: patient identified, IV checked, site marked, risks and benefits discussed, surgical consent, monitors and equipment checked, pre-op evaluation and timeout performed ? ?Epidural ?Patient position: sitting ?Prep: ChloraPrep and site prepped and draped ?Patient monitoring: heart rate, continuous pulse ox and blood pressure ?Approach: midline ?Location: L3-L4 ?Injection technique: LOR saline ? ?Needle:  ?Needle type: Tuohy  ?Needle gauge: 17 G ?Needle length: 9 cm ?Needle insertion depth: 6 cm ?Catheter type: closed end flexible ?Catheter size: 19 Gauge ?Catheter at skin depth: 11 cm ?Test dose: negative and 1.5% lidocaine with Epi 1:200 K ? ?Assessment ?Sensory level: T10 ?Events: blood not aspirated, injection not painful, no injection resistance, no paresthesia and negative IV test ? ?Additional Notes ?first attempt ?Pt. Evaluated and documentation done after procedure finished. ?Patient identified. Risks/Benefits/Options discussed with patient including but not limited to bleeding, infection, nerve damage, paralysis, failed block, incomplete pain control, headache, blood pressure changes, nausea, vomiting, reactions to medication both or allergic, itching and postpartum back pain. Confirmed with bedside nurse the patient's most recent platelet count. Confirmed with patient that they are not currently taking any anticoagulation, have any bleeding history or any family history of bleeding disorders. Patient expressed understanding and wished to proceed. All questions were answered. Sterile technique was used throughout the entire procedure. Please see nursing notes for vital signs. Test dose was given through epidural catheter and negative prior to continuing to dose epidural or start infusion.  Warning signs of high block given to the patient including shortness of breath, tingling/numbness in hands, complete motor block, or any concerning symptoms with instructions to call for help. Patient was given instructions on fall risk and not to get out of bed. All questions and concerns addressed with instructions to call with any issues or inadequate analgesia.   ?  ?Patient tolerated the insertion well without immediate complications.  ?Reason for block: procedure for painReason for block:procedure for pain ? ? ? ?

## 2021-04-29 NOTE — OB Triage Note (Signed)
Patient states she got her membranes stripped two days ago and had contractions that night that got better throughout the day and then they started again this morning at 07000. Since 1330 they have been about 6-10 minutes apart. 6/10 pain scale. Denies LOF or any other concerns. Patient does state she has had 4 loose stools. Baby is moving but "not as much". ?

## 2021-04-30 LAB — CBC
HCT: 32.1 % — ABNORMAL LOW (ref 36.0–46.0)
Hemoglobin: 10.9 g/dL — ABNORMAL LOW (ref 12.0–15.0)
MCH: 29.9 pg (ref 26.0–34.0)
MCHC: 34 g/dL (ref 30.0–36.0)
MCV: 88.2 fL (ref 80.0–100.0)
Platelets: 205 10*3/uL (ref 150–400)
RBC: 3.64 MIL/uL — ABNORMAL LOW (ref 3.87–5.11)
RDW: 14.2 % (ref 11.5–15.5)
WBC: 10.3 10*3/uL (ref 4.0–10.5)
nRBC: 0 % (ref 0.0–0.2)

## 2021-04-30 LAB — RPR: RPR Ser Ql: NONREACTIVE

## 2021-04-30 MED ORDER — PRENATAL MULTIVITAMIN CH
1.0000 | ORAL_TABLET | Freq: Every day | ORAL | Status: DC
  Administered 2021-04-30 – 2021-05-01 (×2): 1 via ORAL
  Filled 2021-04-30 (×2): qty 1

## 2021-04-30 MED ORDER — ACETAMINOPHEN 325 MG PO TABS: 650.0000 mg | ORAL_TABLET | ORAL | Status: DC | PRN

## 2021-04-30 MED ORDER — COCONUT OIL OIL: 1.0000 "application " | TOPICAL_OIL | Status: DC | PRN

## 2021-04-30 MED ORDER — BENZOCAINE-MENTHOL 20-0.5 % EX AERO
1.0000 "application " | INHALATION_SPRAY | CUTANEOUS | Status: DC | PRN
  Administered 2021-04-30 (×2): 1 via TOPICAL
  Filled 2021-04-30 (×2): qty 56

## 2021-04-30 MED ORDER — DIPHENHYDRAMINE HCL 25 MG PO CAPS: 25.0000 mg | ORAL_CAPSULE | Freq: Four times a day (QID) | ORAL | Status: DC | PRN

## 2021-04-30 MED ORDER — TETANUS-DIPHTH-ACELL PERTUSSIS 5-2.5-18.5 LF-MCG/0.5 IM SUSY
0.5000 mL | PREFILLED_SYRINGE | INTRAMUSCULAR | Status: DC | PRN
  Filled 2021-04-30: qty 0.5

## 2021-04-30 MED ORDER — IBUPROFEN 600 MG PO TABS
600.0000 mg | ORAL_TABLET | Freq: Four times a day (QID) | ORAL | Status: DC
  Administered 2021-04-30 – 2021-05-01 (×6): 600 mg via ORAL
  Filled 2021-04-30 (×6): qty 1

## 2021-04-30 MED ORDER — ONDANSETRON HCL 4 MG/2ML IJ SOLN: 4.0000 mg | INTRAMUSCULAR | Status: DC | PRN

## 2021-04-30 MED ORDER — SENNOSIDES-DOCUSATE SODIUM 8.6-50 MG PO TABS
2.0000 | ORAL_TABLET | Freq: Every day | ORAL | Status: DC
  Administered 2021-04-30 – 2021-05-01 (×2): 2 via ORAL
  Filled 2021-04-30 (×2): qty 2

## 2021-04-30 MED ORDER — ONDANSETRON HCL 4 MG PO TABS
4.0000 mg | ORAL_TABLET | ORAL | Status: DC | PRN
Start: 2021-04-30 — End: 2021-05-01

## 2021-04-30 MED ORDER — DIBUCAINE (PERIANAL) 1 % EX OINT
1.0000 | TOPICAL_OINTMENT | CUTANEOUS | Status: DC | PRN
Start: 2021-04-30 — End: 2021-05-01
  Administered 2021-04-30: 1 via RECTAL
  Filled 2021-04-30: qty 28

## 2021-04-30 MED ORDER — WITCH HAZEL-GLYCERIN EX PADS
1.0000 "application " | MEDICATED_PAD | CUTANEOUS | Status: DC | PRN
  Administered 2021-04-30: 1 via TOPICAL
  Filled 2021-04-30: qty 100

## 2021-04-30 MED ORDER — SIMETHICONE 80 MG PO CHEW: 80.0000 mg | CHEWABLE_TABLET | ORAL | Status: DC | PRN

## 2021-04-30 NOTE — Progress Notes (Signed)
Admit Date: 04/29/2021 ?Today's Date: 04/30/2021 ? ?Post Partum Day 0 ? ?Subjective:  ?no complaints, up ad lib, voiding, and tolerating PO ? ?Objective: ?Temp:  [98 ?F (36.7 ?C)-98.6 ?F (37 ?C)] 98.1 ?F (36.7 ?C) (03/18 0734) ?Pulse Rate:  [79-152] 86 (03/18 0734) ?Resp:  [16-20] 16 (03/18 0734) ?BP: (118-150)/(60-121) 145/94 (03/18 0734) ?SpO2:  [99 %-100 %] 99 % (03/18 0734) ?Weight:  [80.7 kg] 80.7 kg (03/17 1605) ? ?Physical Exam:  ?General: alert, cooperative, and no distress ?Lochia: appropriate ?Uterine Fundus: firm ?Incision: none ?DVT Evaluation: No evidence of DVT seen on physical exam. ? ?Recent Labs  ?  04/29/21 ?1739 04/30/21 ?0625  ?HGB 14.0 10.9*  ?HCT 41.7 32.1*  ? ? ?Assessment/Plan: ?Breastfeeding, Contraception - planning interval lap tubal, and Infant doing well ?PNV ? ? LOS: 1 day  ? ?Rachel Richards ?Rachel Richards ?04/30/2021, 10:16 AM  ? ?

## 2021-05-01 ENCOUNTER — Encounter: Payer: Self-pay | Admitting: Obstetrics & Gynecology

## 2021-05-01 NOTE — Discharge Instructions (Signed)

## 2021-05-01 NOTE — Progress Notes (Signed)
Patient discharged home with family.  Discharge instructions, when to follow up, and prescriptions reviewed with patient.  Patient verbalized understanding. Patient will be escorted out by auxiliary.   

## 2021-05-01 NOTE — Anesthesia Postprocedure Evaluation (Signed)
Anesthesia Post Note ? ?Patient: BLESSING ZAUCHA ? ?Procedure(s) Performed: AN AD HOC LABOR EPIDURAL ? ?Patient location during evaluation: Mother Baby ?Anesthesia Type: Epidural ?Level of consciousness: awake and alert ?Pain management: pain level controlled ?Vital Signs Assessment: post-procedure vital signs reviewed and stable ?Respiratory status: spontaneous breathing, nonlabored ventilation and respiratory function stable ?Cardiovascular status: stable ?Postop Assessment: no headache, no backache and epidural receding ?Anesthetic complications: no ? ? ?No notable events documented. ? ? ?Last Vitals:  ?Vitals:  ? 04/30/21 2349 05/01/21 0830  ?BP: 114/73 123/72  ?Pulse: 86 86  ?Resp: 18 15  ?Temp: 37 ?C 36.5 ?C  ?SpO2: 100% 99%  ?  ?Last Pain:  ?Vitals:  ? 05/01/21 0830  ?TempSrc: Oral  ?PainSc:   ? ? ?  ?  ?  ?  ?  ?  ? ?Corinda Gubler ? ? ? ? ?

## 2021-05-04 ENCOUNTER — Encounter: Payer: Medicaid Other | Admitting: Advanced Practice Midwife

## 2021-05-07 ENCOUNTER — Inpatient Hospital Stay (HOSPITAL_COMMUNITY)
Admission: AD | Admit: 2021-05-07 | Payer: Medicaid Other | Source: Home / Self Care | Admitting: Obstetrics and Gynecology

## 2021-05-11 ENCOUNTER — Encounter: Payer: Medicaid Other | Admitting: Advanced Practice Midwife

## 2021-05-13 ENCOUNTER — Telehealth: Payer: Self-pay | Admitting: Obstetrics and Gynecology

## 2021-05-13 ENCOUNTER — Encounter: Payer: Self-pay | Admitting: Advanced Practice Midwife

## 2021-05-13 NOTE — Telephone Encounter (Signed)
-----   Message from Nadara Mustard, MD sent at 05/01/2021 10:30 AM EDT ----- ?Regarding: Sch postpartum/preop w Dr Jolayne Panther or new doctor (for lap tubal) ?Surgery Booking Request ?Patient Full Name:  MEGEAN FABIO  ?MRN: [id number]  ?DOB: [birth year]  ?Surgeon: any ?Requested Surgery Date and Time: Late Apr/Early May ?Primary Diagnosis AND Code: Sterility ?Secondary Diagnosis and Code:  ?Surgical Procedure: Laparoscopy with Tubal Partial Salingectomy ?RNFA Requested?: No ?L&D Notification: No ?Admission Status: same day surgery ?Length of Surgery: 25 min ?Special Case Needs: No ?H&P: Yes ?Phone Interview???:  Yes ?Interpreter: No ?Medical Clearance:  No ?Special Scheduling Instructions: No ?Any known health/anesthesia issues, diabetes, sleep apnea, latex allergy, defibrillator/pacemaker?: No ?Acuity: P3 ?  (P1 highest, P2 delay may cause harm, P3 low, elective gyn, P4 lowest) ?Post op follow up visits: 2 wweeks ? ? ?

## 2021-05-13 NOTE — Telephone Encounter (Signed)
Called patient to schedule Laparoscopy with tubal partial salpingectomy with Dr Elly Modena ? ?DOS 06/23/21 ? ?PP & BTL consult  @ 05/17/21 @ 10:55 ? ?Pre-admit phone call appointment to be requested. All appointments will be updated on pt MyChart. Explained that this appointment has a call window. Based on the time scheduled will indicate if the call will be received within a 4 hour window before 1:00 or after. ? ?Advised that pt may also receive calls from the hospital pharmacy and pre-service center. ? ?Confirmed pt has [employer] as primary insurance. ?No secondary insurance.  ? ?Medicaid consent form signed 04/20/21. ?

## 2021-05-16 ENCOUNTER — Encounter: Payer: Self-pay | Admitting: Advanced Practice Midwife

## 2021-05-17 ENCOUNTER — Telehealth: Payer: Self-pay

## 2021-05-17 ENCOUNTER — Ambulatory Visit: Payer: Medicaid Other | Admitting: Obstetrics and Gynecology

## 2021-05-17 NOTE — Telephone Encounter (Signed)
-----   Message from Catalina Antigua, MD sent at 05/17/2021 11:37 AM EDT ----- Patient did not come in for BTL consult on 05/17/21. I see that she is scheduled for a pp visit on 4/24 with a CNM. Can that patient be moved to my schedule (and please swap out a patient of mine onto the CNM schedule) so that I can meet and counsel her prior to her surgery.  If the patient no longer desires a BTL, she can stay on the midwife schedule on 06/06/21  Please let me know  Peggy

## 2021-05-17 NOTE — Telephone Encounter (Signed)
I contacted patient due to no show for today's visit. Patient is scheduled for 05/18/21 at [facility] for BP and 06/06/21 with [facility] for BLT consult with 6 wk PP visit

## 2021-05-18 ENCOUNTER — Ambulatory Visit (INDEPENDENT_AMBULATORY_CARE_PROVIDER_SITE_OTHER): Payer: Medicaid Other | Admitting: Advanced Practice Midwife

## 2021-05-18 ENCOUNTER — Encounter: Payer: Self-pay | Admitting: Advanced Practice Midwife

## 2021-05-18 VITALS — BP 118/60 | Ht 62.0 in | Wt 153.0 lb

## 2021-05-18 DIAGNOSIS — R519 Headache, unspecified: Secondary | ICD-10-CM

## 2021-05-18 DIAGNOSIS — O9089 Other complications of the puerperium, not elsewhere classified: Secondary | ICD-10-CM

## 2021-05-18 MED ORDER — BUTALBITAL-APAP-CAFFEINE 50-325-40 MG PO CAPS: 1.0000 | ORAL_CAPSULE | Freq: Three times a day (TID) | ORAL | 3 refills | Status: DC | PRN

## 2021-05-18 NOTE — Progress Notes (Signed)
? ?Patient ID: Rachel Richards, female   DOB: [birth year], 26 y.o.   MRN: [id number] ? ?Reason for Visit: Blood Pressure Check (Daily headaches) ? ? ?Subjective:  ? ? ?Rachel Richards is a 26 y.o. female being seen for concern of postpartum hypertension due to headache for the past week. The headache is frontal/forehead and throbbing. She has minimal relief from ibuprofen. She reports congestion in the same time period and others in the household with congestion symptoms. She has not taken sudafed since she is breastfeeding. She is trying to stay hydrated and may not be getting adequate sleep since she has a newborn. Reassurance given today of normal blood pressure and likely sinus headache. Rx fioricet and other recommendations given. ? ?Past Medical History:  ?Diagnosis Date  ? Chronic thoracic back pain   ? Depression   ? Marijuana use   ? ?Family History  ?Problem Relation Age of Onset  ? Colon cancer Maternal Uncle 35  ? Other Mother   ?     MVA  ? Healthy Father   ? ?Past Surgical History:  ?Procedure Laterality Date  ? HAND SURGERY Right 2016  ? repair broken hand  ? WISDOM TOOTH EXTRACTION  2014  ? all four  ? ? ?Short Social History:  ?Social History  ? ?Tobacco Use  ? Smoking status: Never  ? Smokeless tobacco: Never  ?Substance Use Topics  ? Alcohol use: Yes  ?  Comment: sometimes but not during pregnancy  ? ? ?No Known Allergies ? ?Current Outpatient Medications  ?Medication Sig Dispense Refill  ? Butalbital-APAP-Caffeine 50-325-40 MG capsule Take 1-2 capsules by mouth every 8 (eight) hours as needed for headache. 30 capsule 3  ? Cholecalciferol (VITAMIN D-3) 25 MCG (1000 UT) CAPS     ? Prenatal Vit-Fe Fumarate-FA (PRENATAL VITAMIN PO) Take 1 tablet by mouth daily.    ? valACYclovir (VALTREX) 500 MG tablet Take 1 tablet (500 mg total) by mouth 2 (two) times daily. 60 tablet 6  ? ?No current facility-administered medications for this visit.  ? ? ?Review of Systems  ?Constitutional:  Negative for chills and  fever.  ?HENT:  Negative for congestion, ear discharge, ear pain, hearing loss, sinus pain and sore throat.   ?Eyes:  Negative for blurred vision and double vision.  ?Respiratory:  Negative for cough, shortness of breath and wheezing.   ?Cardiovascular:  Negative for chest pain, palpitations and leg swelling.  ?Gastrointestinal:  Negative for abdominal pain, blood in stool, constipation, diarrhea, heartburn, melena, nausea and vomiting.  ?Genitourinary:  Negative for dysuria, flank pain, frequency, hematuria and urgency.  ?Musculoskeletal:  Negative for back pain, joint pain and myalgias.  ?Skin:  Negative for itching and rash.  ?Neurological:  Positive for headaches. Negative for dizziness, tingling, tremors, sensory change, speech change, focal weakness, seizures, loss of consciousness and weakness.  ?Endo/Heme/Allergies:  Negative for environmental allergies. Does not bruise/bleed easily.  ?Psychiatric/Behavioral:  Negative for depression, hallucinations, memory loss, substance abuse and suicidal ideas. The patient is not nervous/anxious and does not have insomnia.   ? ? ?   ?Objective:  ?Objective  ? ?Vitals:  ? 05/18/21 1322  ?BP: 118/60  ?Weight: 153 lb (69.4 kg)  ?Height: 5\' 2"  (1.575 m)  ? ?Body mass index is 27.98 kg/m? ?Constitutional: Well nourished, well developed female in no acute distress.  ?HEENT: normal ?Skin: Warm and dry.  ?Extremity:  no edema   ?Respiratory: Normal respiratory effort ?Neuro: DTRs 2+, Cranial nerves grossly intact ?Psych:  Alert and Oriented x3. No memory deficits. Normal mood and affect.  ? ? ?Assessment/Plan:  ?  ? ?26 y.o. G2 P30 female 3 weeks postpartum with likely sinus headache, normal BP ? ?Rx Fioricet ?Zyrtec as needed for allergies ?Saline nasal spray ?Increase hydration ?Return to clinic for scheduled appointment ? ? ?Tresea Mall CNM ?[facility] ?[facility] ?05/18/2021, 2:39 PM ? ? ?

## 2021-05-18 NOTE — Patient Instructions (Signed)
Sinus Headache A sinus headache occurs when your sinuses become clogged or swollen. Sinuses are air-filled spaces in your skull that are behind the bones of your face and forehead. Sinus headaches can range from mild to severe. What are the causes? A sinus headache can result from various conditions that affect the sinuses. Common causes include: Colds. Sinus infections. Allergies. Many people confuse sinus headaches with migraines or tension headaches because both headaches can cause facial pain and nasal symptoms. What are the signs or symptoms? The main symptom of this condition is a headache that may feel like pain or pressure in your face, forehead, ears, or upper teeth. People who have a sinus headache often have other symptoms, such as: Congested or runny nose. Fever. Inability to smell. Weather changes can make symptoms worse. How is this diagnosed? This condition may be diagnosed based on: A physical exam and medical history. Imaging tests, such as a CT scan or MRI, to check for problems with the sinuses. Examination of the sinuses using a thin tool with a camera that is inserted through your nose (endoscopy). How is this treated? Treatment for this condition depends on the cause. Sinus pain that is caused by a sinus infection may be treated with antibiotic medicine. Sinus pain that is caused by congestion may be helped by rinsing out (flushing) the nose and sinuses with saline solution. Sinus pain that is caused by allergies may be helped by allergy medicines (antihistamines) and medicated nasal sprays. Sinus surgery may be needed in some cases if other treatments do not help. Follow these instructions at home: General instructions If directed: Apply a warm, moist washcloth to your face to help relieve pain. Use a nasal saline wash. Hydrate and humidify Drink enough water to keep your urine clear or pale yellow. Staying hydrated will help to thin your mucus. Use a cool mist  humidifier to keep the humidity level in your home above 50%. Inhale steam for 10-15 minutes, 3-4 times a day or as told by your health care provider. You can do this in the bathroom while a hot shower is running. Limit your exposure to cool or dry air. Medicines  Take over-the-counter and prescription medicines only as told by your health care provider. If you were prescribed an antibiotic medicine, take it as told by your health care provider. Do not stop taking the antibiotic even if you start to feel better. If you have congestion, use a nasal spray to help lessen pressure. Contact a health care provider if: You have a headache more than one time a week. You have sensitivity to light or sound. You develop a fever. You feel nauseous or you vomit. Your headaches do not get better with treatment. Many people think that they have a sinus headache when they actually have a migraine or a tension headache. Get help right away if: You have vision problems. You have sudden, severe pain in your face or head. You have a seizure. You are confused. You have a stiff neck. Summary A sinus headache occurs when your sinuses become clogged or swollen. A sinus headache can result from various conditions that affect the sinuses, such as a cold, a sinus infection, or an allergy. Treatment for this condition depends on the cause. It may include medicine, such as antibiotics or antihistamines. This information is not intended to replace advice given to you by your health care provider. Make sure you discuss any questions you have with your health care provider. Document Revised: 11/07/2019   Document Reviewed: 11/11/2019 Elsevier Patient Education  2022 Elsevier Inc.  

## 2021-06-06 ENCOUNTER — Encounter: Payer: Self-pay | Admitting: Obstetrics and Gynecology

## 2021-06-06 ENCOUNTER — Ambulatory Visit: Payer: Medicaid Other | Admitting: Advanced Practice Midwife

## 2021-06-06 ENCOUNTER — Ambulatory Visit (INDEPENDENT_AMBULATORY_CARE_PROVIDER_SITE_OTHER): Payer: Medicaid Other | Admitting: Obstetrics and Gynecology

## 2021-06-06 NOTE — Progress Notes (Signed)
? ? ?  Post Partum Visit Note ? ?Rachel Richards is a 26 y.o. G34P2002 female who presents for a postpartum visit. She is 6 weeks postpartum following a normal spontaneous vaginal delivery.  I have fully reviewed the prenatal and intrapartum course. The delivery was at 38 gestational weeks.  Anesthesia: epidural. Postpartum course has been uncomplicated. Baby is doing well. Baby is feeding by breast. Bleeding no bleeding. Bowel function is normal. Bladder function is normal. Patient is not sexually active. Contraception method is abstinence. Postpartum depression screening: negative. ? ? ?The pregnancy intention screening data noted above was reviewed. Potential methods of contraception were discussed. The patient elected to proceed with No data recorded. ? ? ? ?Health Maintenance Due  ?Topic Date Due  ? COVID-19 Vaccine (3 - Pfizer risk series) 12/10/2019  ? ? ?  ? ?Review of Systems ?Pertinent items noted in HPI and remainder of comprehensive ROS otherwise negative. ? ?Objective:  ?BP 120/62   Ht 5\' 2"  (1.575 m)   Wt 155 lb (70.3 kg)   Breastfeeding Yes   BMI 28.35 kg/m?   ? ?General:  alert, cooperative, and no distress  ? Breasts:  normal  ?Lungs: clear to auscultation bilaterally  ?Heart:  regular rate and rhythm  ?Abdomen: soft, non-tender; bowel sounds normal; no masses,  no organomegaly   ?Wound   ?GU exam:  normal  ?     ?Assessment:  ? ? 1. Routine postpartum follow-up ? ? ? ?Normal postpartum exam.  ? ?Plan:  ? ?Essential components of care per ACOG recommendations: ? ?1.  Mood and well being: Patient with negative depression screening today. Reviewed local resources for support.  ?- Patient tobacco use? No.   ?- hx of drug use? No.   ? ?2. Infant care and feeding:  ?-Patient currently breastmilk feeding? Yes. Reviewed importance of draining breast regularly to support lactation.  ?-Social determinants of health (SDOH) reviewed in EPIC. No concerns ? ?3. Sexuality, contraception and birth spacing ?-  Patient does not want a pregnancy in the next year.  Desired family size is 2 children.  ?- Reviewed reproductive life planning. Reviewed contraceptive methods based on pt preferences and effectiveness.  Patient desired BTL scheduled on 5/11   ?- Discussed birth spacing of 18 months ? ?4. Sleep and fatigue ?-Encouraged family/partner/community support of 4 hrs of uninterrupted sleep to help with mood and fatigue ? ?5. Physical Recovery  ?- Discussed patients delivery and complications. She describes her labor as good. ?- Patient had a Vaginal, no problems at delivery. Patient had a 2nd degree laceration. Perineal healing reviewed. Patient expressed understanding ?- Patient has urinary incontinence? No. ?- Patient is safe to resume physical and sexual activity ? ?6.  Health Maintenance ?- HM due items addressed Yes ?- Last pap smear  ?Diagnosis  ?Date Value Ref Range Status  ?10/13/2020   Final  ? - Negative for intraepithelial lesion or malignancy (NILM)  ? Pap smear not done at today's visit.  ?-Breast Cancer screening indicated? No.  ? ?7. Chronic Disease/Pregnancy Condition follow up: None ? ?- PCP follow up ? ?10/15/2020, MD ?[facility] [place], [facility] ? ?

## 2021-06-06 NOTE — Progress Notes (Deleted)
Subjective:  ?  ? Rachel Richards is a 26 y.o. female who presents for a postpartum visit. She is {1-10:13787} {time; units:18646} postpartum following a {delivery:12449}. I have fully reviewed the prenatal and intrapartum course. The delivery was at *** gestational weeks. Outcome: {delivery outcome:32078}. Anesthesia: {anesthesia types:812}. Postpartum course has been ***. Baby's course has been ***. Baby is feeding by {breast/bottle:69}. Bleeding {vag bleed:12292}. Bowel function is {normal:32111}. Bladder function is {normal:32111}. Patient {is/is not:9024} sexually active. Contraception method is {contraceptive method:5051}. Postpartum depression screening: {neg default:13464}. ? ?{Common ambulatory SmartLinks:19316} ? ?Review of Systems ?{ros; complete:30496}  ? ?Objective:  ? ? There were no vitals taken for this visit.  ?General:  {gen appearance:16600}  ? Breasts:  {breast exam:1202}  ?Lungs: {lung exam:16931}  ?Heart:  {heart exam:5510}  ?Abdomen: {abdomen exam:16834}  ? Vulva:  {labia exam:12198}  ?Vagina: {vagina exam:12200}  ?Cervix:  {cervix exam:14595}  ?Corpus: {uterus exam:12215}  ?Adnexa:  {adnexa exam:12223}  ?Rectal Exam: {rectal/vaginal exam:12274}  ?      ?Assessment:  ? ? *** postpartum exam. Pap smear {done:10129} at today's visit.  ? ?Plan:  ? ? 1. Contraception: {method:5051} ?2. *** ?3. Follow up in: {1-10:13787} {time; units:19136} or as needed.  ?

## 2021-06-15 ENCOUNTER — Other Ambulatory Visit: Payer: Medicaid Other

## 2021-06-23 ENCOUNTER — Ambulatory Visit: Admit: 2021-06-23 | Payer: Medicaid Other | Admitting: Obstetrics and Gynecology

## 2021-06-23 SURGERY — SALPINGECTOMY, BILATERAL, LAPAROSCOPIC
Anesthesia: Choice | Laterality: Bilateral

## 2021-06-27 ENCOUNTER — Other Ambulatory Visit (HOSPITAL_COMMUNITY)
Admission: RE | Admit: 2021-06-27 | Discharge: 2021-06-27 | Disposition: A | Discharge status: Home / Self Care | Payer: Medicaid Other | Source: Ambulatory Visit | Attending: Advanced Practice Midwife | Admitting: Advanced Practice Midwife

## 2021-06-27 ENCOUNTER — Ambulatory Visit (INDEPENDENT_AMBULATORY_CARE_PROVIDER_SITE_OTHER): Payer: Medicaid Other | Admitting: Advanced Practice Midwife

## 2021-06-27 ENCOUNTER — Encounter: Payer: Self-pay | Admitting: Advanced Practice Midwife

## 2021-06-27 VITALS — BP 94/60 | Ht 62.0 in | Wt 151.0 lb

## 2021-06-27 DIAGNOSIS — T148XXD Other injury of unspecified body region, subsequent encounter: Secondary | ICD-10-CM

## 2021-06-27 DIAGNOSIS — Z30017 Encounter for initial prescription of implantable subdermal contraceptive: Secondary | ICD-10-CM

## 2021-06-27 NOTE — Patient Instructions (Signed)

## 2021-06-29 LAB — CERVICOVAGINAL ANCILLARY ONLY
Bacterial Vaginitis (gardnerella): NEGATIVE
Candida Glabrata: NEGATIVE
Candida Vaginitis: NEGATIVE
Comment: NEGATIVE
Comment: NEGATIVE
Comment: NEGATIVE

## 2021-06-30 NOTE — Progress Notes (Signed)
Patient ID: Rachel Richards, female   DOB: 1995-12-12, 26 y.o.   MRN: [id number]  Reason for Consult: Contraception (Maybe Nexplanon) and Vaginal Exam (Discomfort when urinating and having bowel movement)   Subjective:  Date of Service: 06/27/2021  HPI:  Rachel Richards is a 26 y.o. female approximately 8 weeks postpartum vaginal delivery. She had considered tubal ligation and since changed her mind. She is interested in Nexplanon for birth control. Her questions are answered. See procedure note for details.   She mentions pain of the perineum- near the tear area- that is causing some burning with urination and also some pain with bowel movement. She wonders if there is a stitch persisting.   Past Medical History:  Diagnosis Date   Chronic thoracic back pain    Depression    Marijuana use    Family History  Problem Relation Age of Onset   Other Mother        MVA   Healthy Father    Colon cancer Maternal Uncle 39   Past Surgical History:  Procedure Laterality Date   HAND SURGERY Right 2016   repair broken hand   WISDOM TOOTH EXTRACTION  2014   all four    Short Social History:  Social History   Tobacco Use   Smoking status: Never   Smokeless tobacco: Never  Substance Use Topics   Alcohol use: Yes    Comment: sometimes but not during pregnancy    No Known Allergies  Current Outpatient Medications  Medication Sig Dispense Refill   Cholecalciferol (VITAMIN D-3) 25 MCG (1000 UT) CAPS      etonogestrel (NEXPLANON) 68 MG IMPL implant 1 each by Subdermal route once.     Prenatal Vit-Fe Fumarate-FA (PRENATAL VITAMIN PO) Take 1 tablet by mouth daily.     valACYclovir (VALTREX) 500 MG tablet Take 1 tablet (500 mg total) by mouth 2 (two) times daily. 60 tablet 6   No current facility-administered medications for this visit.    Review of Systems  Constitutional:  Negative for chills and fever.  HENT:  Negative for congestion, ear discharge, ear pain, hearing loss,  sinus pain and sore throat.   Eyes:  Negative for blurred vision and double vision.  Respiratory:  Negative for cough, shortness of breath and wheezing.   Cardiovascular:  Negative for chest pain, palpitations and leg swelling.  Gastrointestinal:  Negative for abdominal pain, blood in stool, constipation, diarrhea, heartburn, melena, nausea and vomiting.  Genitourinary:  Negative for dysuria, flank pain, frequency, hematuria and urgency.       Positive for pain at perineum  Musculoskeletal:  Negative for back pain, joint pain and myalgias.  Skin:  Negative for itching and rash.  Neurological:  Negative for dizziness, tingling, tremors, sensory change, speech change, focal weakness, seizures, loss of consciousness, weakness and headaches.  Endo/Heme/Allergies:  Negative for environmental allergies. Does not bruise/bleed easily.  Psychiatric/Behavioral:  Negative for depression, hallucinations, memory loss, substance abuse and suicidal ideas. The patient is not nervous/anxious and does not have insomnia.        Objective:  Objective   Vitals:   06/27/21 1433  BP: 94/60  Weight: 151 lb (68.5 kg)  Height: 5\' 2"  (1.575 m)   Body mass index is 27.62 kg/m. Constitutional: Well nourished, well developed female in no acute distress.  HEENT: normal Skin: Warm and dry.  Extremity:  no edema   Respiratory:  Normal respiratory effort Neuro: DTRs 2+, Cranial nerves grossly intact Psych: Alert  and Oriented x3. No memory deficits. Normal mood and affect.   Pelvic exam:  is not limited by body habitus EGBUS: small raw area at perineum- laceration healed, no evidence of stitch remnant Vagina: within normal limits and with normal mucosa, thin white discharge  Update to visit: aptima negative for yeast/bv Assessment/Plan:     26 y.o. G2 P43 female contraceptive management, delayed wound healing at perineum  Nexplanon inserted- see procedure note Aptima: vaginitis Sea Salt soaks Apply  coconut or other barrier cream to affected area Wound healing: increase intake of protein, vitamins A and C, zinc   [facility] 06/30/2021, 5:41 PM      GYNECOLOGY PROCEDURE NOTE  Patient is a 26 y.o. [id number] presenting for Nexplanon insertion as her desired means of contraception.  She provided informed consent, signed copy in the chart, time out was performed.   She understands that Nexplanon is a progesterone only therapy, and that patients often have irregular and unpredictable vaginal bleeding or amenorrhea. She understands that other side effects are possible related to systemic progesterone, including but not limited to, headaches, breast tenderness, nausea, and irritability. While effective at preventing pregnancy long acting reversible contraceptives do not prevent transmission of sexually transmitted diseases and use of barrier methods for this purpose was discussed. The placement procedure for Nexplanon was reviewed with the patient in detail including risks of nerve injury, infection, bleeding and injury to other muscles or tendons. She understands that the Nexplanon implant is good for 3 years and needs to be removed at the end of that time.  She understands that Nexplanon is an extremely effective option for contraception, with failure rate of <1%. This information is reviewed today and all questions were answered. Informed consent was obtained, both verbally and written.   The patient is healthy and has no contraindications to Nexplanon use.   Procedure Appropriate time out taken.  Patient placed in dorsal supine with left arm above head, elbow flexed at 90 degrees, arm resting on examination table.  The bicipital groove was palpated and site 8-10cm proximal to the medial epicondyle was indentified . The insertion site was prepped with  three betadine swabs and then injected with 2.5 ml of 1% lidocaine without epinephrine.  Nexplanon  removed form sterile blister packaging,  Device confirmed in needle, before inserting full length of needle, tenting up the skin as the needle was advanced.  The drug eluding rod was then deployed by pulling back the slider per the manufactures recommendation.  The implant was palpable by the clinician as well as the patient.  The insertion site covered dressed with a steri strip and band aid before applying  a kerlex bandage pressure dressing..Minimal blood loss was noted during the procedure.  The patient tolerated the procedure well.   She was instructed to wear the bandage for 24 hours, call with any signs of infection.  She was given the Nexplanon card and instructed to have the rod removed in 3 years.   Christean Leaf, CNM [facility] 06/30/21, 5:45 PM    Charge [phone] for nexplanon device, CPT [phone] for procedure J2001 for lidocaine administration Modifer 25, plus Modifer 79 is done during a global billing visit

## 2021-07-26 ENCOUNTER — Encounter: Payer: Self-pay | Admitting: Advanced Practice Midwife

## 2021-07-27 ENCOUNTER — Telehealth: Payer: Self-pay | Admitting: Licensed Practical Nurse

## 2021-07-27 NOTE — Telephone Encounter (Signed)
Can you please advise what else she could do?

## 2021-07-27 NOTE — Telephone Encounter (Signed)
LVM calling to discuss your concern.  It is difficult to know what you are experiencing based on your my chart message. It may be best that you come back in for an evaluation. Carie Caddy, CNM  [facility], [facility]  @TODAY @  7:38 PM

## 2021-07-28 NOTE — Telephone Encounter (Signed)
Called and left voicemail for patient to call back to be scheduled. 

## 2021-08-03 NOTE — Telephone Encounter (Signed)
Can someone please try giving her another call to get her scheduled?

## 2021-08-03 NOTE — Telephone Encounter (Signed)
Called pt to schedule early appt for dry skin patch that pt has.  Left message for pt to call back to get scheduled.

## 2021-08-04 NOTE — Telephone Encounter (Signed)
Pt is scheduled with Dr. Okey Dupre on 6/23 at 4:15.

## 2021-08-05 ENCOUNTER — Ambulatory Visit (INDEPENDENT_AMBULATORY_CARE_PROVIDER_SITE_OTHER): Payer: Medicaid Other | Admitting: Obstetrics

## 2021-08-05 ENCOUNTER — Encounter: Payer: Self-pay | Admitting: Obstetrics

## 2021-08-11 ENCOUNTER — Ambulatory Visit (INDEPENDENT_AMBULATORY_CARE_PROVIDER_SITE_OTHER): Payer: Medicaid Other | Admitting: Advanced Practice Midwife

## 2021-08-11 ENCOUNTER — Encounter: Payer: Self-pay | Admitting: Advanced Practice Midwife

## 2021-08-11 VITALS — BP 100/60 | Wt 146.0 lb

## 2021-08-11 DIAGNOSIS — B3731 Acute candidiasis of vulva and vagina: Secondary | ICD-10-CM

## 2021-08-11 DIAGNOSIS — L089 Local infection of the skin and subcutaneous tissue, unspecified: Secondary | ICD-10-CM

## 2021-08-11 DIAGNOSIS — B9620 Unspecified Escherichia coli [E. coli] as the cause of diseases classified elsewhere: Secondary | ICD-10-CM

## 2021-08-11 DIAGNOSIS — T148XXD Other injury of unspecified body region, subsequent encounter: Secondary | ICD-10-CM

## 2021-08-11 DIAGNOSIS — B961 Klebsiella pneumoniae [K. pneumoniae] as the cause of diseases classified elsewhere: Secondary | ICD-10-CM

## 2021-08-11 DIAGNOSIS — O8609 Infection of obstetric surgical wound, other surgical site: Secondary | ICD-10-CM

## 2021-08-11 LAB — WOUND CULTURE

## 2021-08-11 MED ORDER — AMOXICILLIN-POT CLAVULANATE 500-125 MG PO TABS: 1.0000 | ORAL_TABLET | Freq: Three times a day (TID) | ORAL | 0 refills | Status: DC

## 2021-08-11 MED ORDER — FLUCONAZOLE 150 MG PO TABS: 150.0000 mg | ORAL_TABLET | Freq: Once | ORAL | 3 refills | Status: AC

## 2021-08-11 NOTE — Progress Notes (Signed)
Patient ID: Rachel Richards, female   DOB: 18-Jul-1995, 26 y.o.   MRN: [id number]  Reason for Consult: Follow-up (Patient comes back to office for follow up for perineal laceration. Patient states that area is healing but she has concerns because laceration is still visible. Patient has concerns of becoming intimate again with her partner and does not want to reopen laceration. )   Subjective:  HPI:  Rachel Richards is a 26 y.o. female being seen for follow up after visit with Dr Okey Dupre last week. She reports improvement in symptoms since having silver nitrate applied. Also reviewed the results of the wound culture: E. Coli and Klebsiella Pneumoniae found in small amounts. No areas of open skin are seen today. Current pain level is low. Will send Rx Augmentin to assist the wound healing process.   Past Medical History:  Diagnosis Date   Chronic thoracic back pain    Depression    Marijuana use    Family History  Problem Relation Age of Onset   Other Mother        MVA   Healthy Father    Colon cancer Maternal Uncle 29   Past Surgical History:  Procedure Laterality Date   HAND SURGERY Right 2016   repair broken hand   WISDOM TOOTH EXTRACTION  2014   all four    Short Social History:  Social History   Tobacco Use   Smoking status: Never   Smokeless tobacco: Never  Substance Use Topics   Alcohol use: Yes    Comment: sometimes but not during pregnancy    No Known Allergies  Current Outpatient Medications  Medication Sig Dispense Refill   amoxicillin-clavulanate (AUGMENTIN) 500-125 MG tablet Take 1 tablet (500 mg total) by mouth 3 (three) times daily. 30 tablet 0   Cholecalciferol (VITAMIN D-3) 25 MCG (1000 UT) CAPS      etonogestrel (NEXPLANON) 68 MG IMPL implant 1 each by Subdermal route once.     fluconazole (DIFLUCAN) 150 MG tablet Take 1 tablet (150 mg total) by mouth once for 1 dose. Can take additional dose three days later if symptoms persist 1 tablet 3    Prenatal Vit-Fe Fumarate-FA (PRENATAL VITAMIN PO) Take 1 tablet by mouth daily.     valACYclovir (VALTREX) 500 MG tablet Take 1 tablet (500 mg total) by mouth 2 (two) times daily. (Patient not taking: Reported on 08/11/2021) 60 tablet 6   No current facility-administered medications for this visit.    Review of Systems  Constitutional:  Negative for chills and fever.  HENT:  Negative for congestion, ear discharge, ear pain, hearing loss, sinus pain and sore throat.   Eyes:  Negative for blurred vision and double vision.  Respiratory:  Negative for cough, shortness of breath and wheezing.   Cardiovascular:  Negative for chest pain, palpitations and leg swelling.  Gastrointestinal:  Negative for abdominal pain, blood in stool, constipation, diarrhea, heartburn, melena, nausea and vomiting.  Genitourinary:  Negative for dysuria, flank pain, frequency, hematuria and urgency.       Positive for minimal pain at site of perineal laceration  Musculoskeletal:  Negative for back pain, joint pain and myalgias.  Skin:  Negative for itching and rash.  Neurological:  Negative for dizziness, tingling, tremors, sensory change, speech change, focal weakness, seizures, loss of consciousness, weakness and headaches.  Endo/Heme/Allergies:  Negative for environmental allergies. Does not bruise/bleed easily.  Psychiatric/Behavioral:  Negative for depression, hallucinations, memory loss, substance abuse and suicidal ideas. The patient  is not nervous/anxious and does not have insomnia.         Objective:  Objective   Vitals:   08/11/21 1602  BP: 100/60  Weight: 146 lb (66.2 kg)   Body mass index is 26.7 kg/m. Constitutional: Well nourished, well developed female in no acute distress.  HEENT: normal Skin: Warm and dry.   Extremity:  no edema   Respiratory:  Normal respiratory effort Neuro: DTRs 2+, Cranial nerves grossly intact Psych: Alert and Oriented x3. No memory deficits. Normal mood and affect.    Pelvic exam:  is not limited by body habitus EGBUS: within normal limits Vagina: remnants of silver nitrate seen at perineum   Assessment/Plan:     26 y.o. G2 P2 female with resolving perineal laceration/bacteria in wound culture  Rx Augmentin: both identified bacteria are susceptible to this antibiotic Rx Diflucan if yeast develops Follow up as needed   Tresea Mall CNM [facility] 08/11/2021, 4:59 PM

## 2021-08-11 NOTE — Patient Instructions (Signed)
Wound Infection A wound infection happens when tiny organisms (microorganisms) start to grow in a wound. A wound infection is most often caused by bacteria. Infection can cause the wound to break open or worsen. Wound infection needs treatment. If a wound infection is left untreated, complications can occur. Untreated wound infections may lead to an infection in the bloodstream (septicemia) or a bone infection (osteomyelitis). What are the causes? This condition is most often caused by bacteria growing in a wound. Other microorganisms, like yeast and fungi, can also cause wound infections. What increases the risk? The following factors may make you more likely to develop this condition: Having a weak body defense system (immune system). Having diabetes. Taking steroid medicines for a long time (chronic use). Smoking. Being an older person. Being overweight. Taking chemotherapy medicines. What are the signs or symptoms? Symptoms of this condition include: Having more redness, swelling, or pain at the wound site. Having more blood or fluid at the wound site. A bad smell coming from a wound or bandage (dressing). Having a fever. Feeling tired or fatigued. Having warmth at or around the wound. Having pus at the wound site. How is this diagnosed? This condition is diagnosed with a medical history and physical exam. You may also have a wound culture or blood tests or both. How is this treated? This condition is usually treated with an antibiotic medicine. The infection should improve 24-48 hours after you start antibiotics. After 24-48 hours, redness around the wound should stop spreading, and the wound should be less painful. Follow these instructions at home: Medicines Take or apply over-the-counter and prescription medicines only as told by your health care provider. If you were prescribed an antibiotic medicine, take or apply it as told by your health care provider. Do not stop using the  antibiotic even if you start to feel better. Wound care  Clean the wound each day, or as told by your health care provider. Wash the wound with mild soap and water. Rinse the wound with water to remove all soap. Pat the wound dry with a clean towel. Do not rub it. Follow instructions from your health care provider about how to take care of your wound. Make sure you: Wash your hands with soap and water before and after you change your dressing. If soap and water are not available, use hand sanitizer. Change your dressing as told by your health care provider. Leave stitches (sutures), skin glue, or adhesive strips in place if your wound has been closed. These skin closures may need to stay in place for 2 weeks or longer. If adhesive strip edges start to loosen and curl up, you may trim the loose edges. Do not remove adhesive strips completely unless your health care provider tells you to do that. Some wounds are left open to heal on their own. Check your wound every day for signs of infection. Watch for: More redness, swelling, or pain. More fluid or blood. Warmth. Pus or a bad smell. General instructions Keep the dressing dry until your health care provider says it can be removed. Do not take baths, swim, or use a hot tub until your health care provider approves. Ask your health care provider if you may take showers. You may only be allowed to take sponge baths. Raise (elevate) the injured area above the level of your heart while you are sitting or lying down. Do not scratch or pick at the wound. Keep all follow-up visits as told by your health care   provider. This is important. Contact a health care provider if: Your pain is not controlled with medicine. You have more redness, swelling, or pain around your wound. You have more fluid or blood coming from your wound. Your wound feels warm to the touch. You have pus coming from your wound. You continue to notice a bad smell coming from your  wound or your dressing. Your wound that was closed breaks open. Get help right away if: You have a red streak going away from your wound. You have a fever. Summary A wound infection happens when tiny organisms (microorganisms) start to grow in a wound. This condition is usually treated with an antibiotic medicine. Follow instructions from your health care provider about how to take care of your wound. Contact a health care provider if your wound infection does not begin to improve in 24-48 hours, or your symptoms worsen. Keep all follow-up visits as told by your health care provider. This is important. This information is not intended to replace advice given to you by your health care provider. Make sure you discuss any questions you have with your health care provider. Document Revised: 11/25/2020 Document Reviewed: 11/25/2020 Elsevier Patient Education  2023 [employer]  

## 2021-11-17 IMAGING — US US OB COMP +14 WK
1 series · 13 of 28 positions shown · non-contrast
Comparison: none

CLINICAL DATA: Second trimester pregnancy for fetal anatomy survey.

EXAM:
OBSTETRICAL ULTRASOUND >14 WKS

[Series 1: us ob comp + 14 wk · 83 acquisitions, 13 frames shown]
[im 4/83]
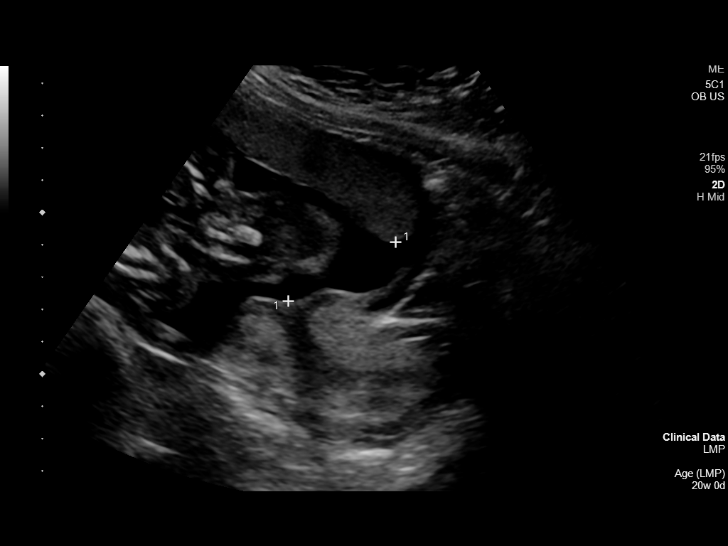
[im 10/83]
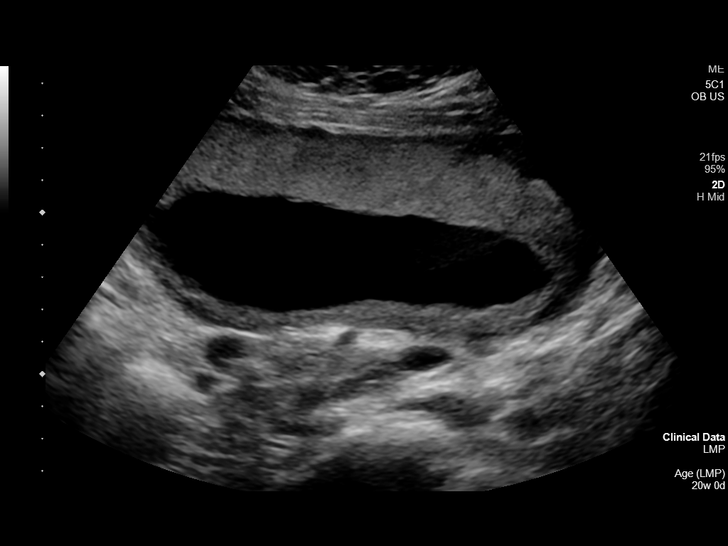
[im 16/83]
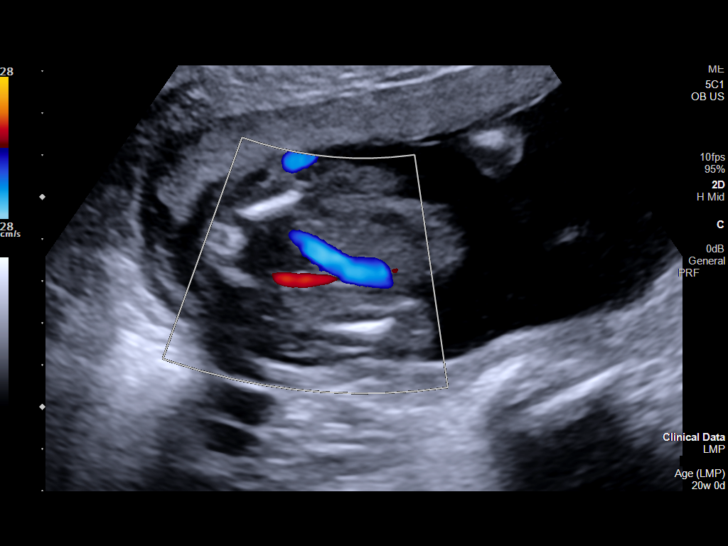
[im 22/83]
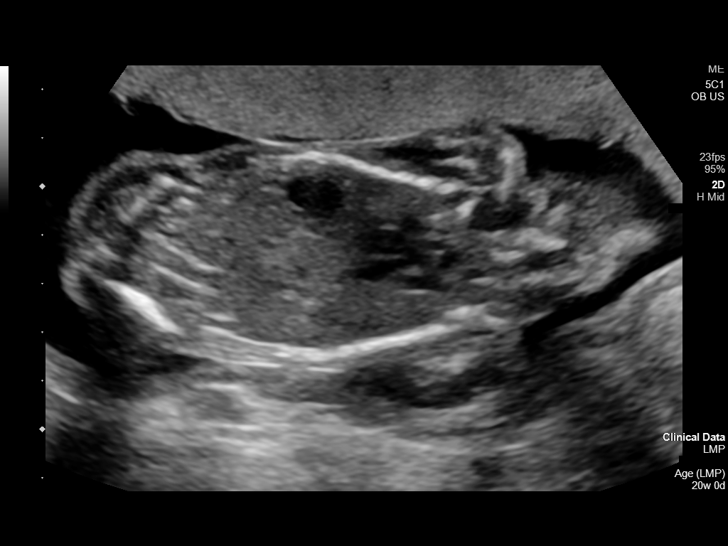
[im 28/83]
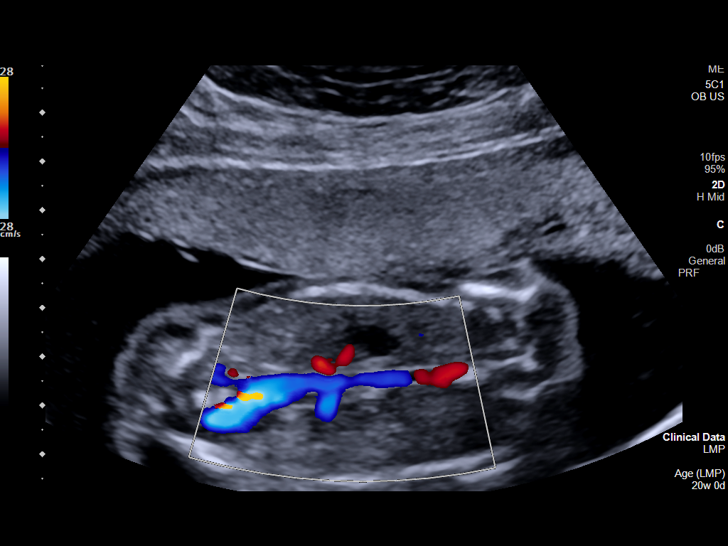
[im 34/83]
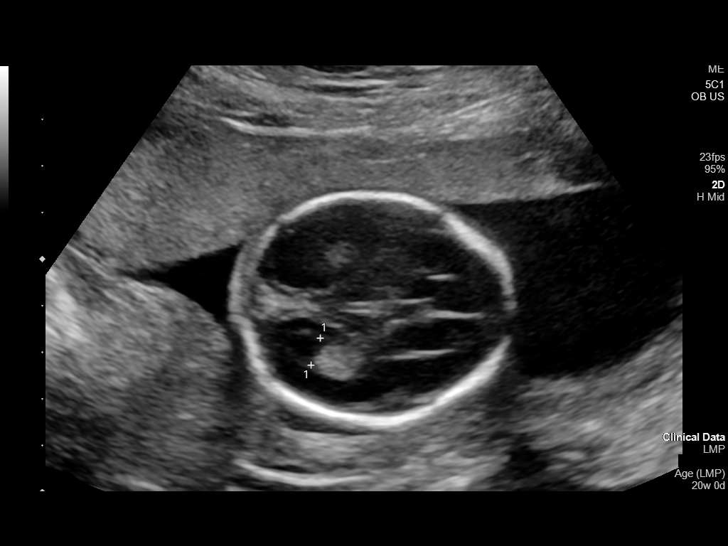
[im 43/83]
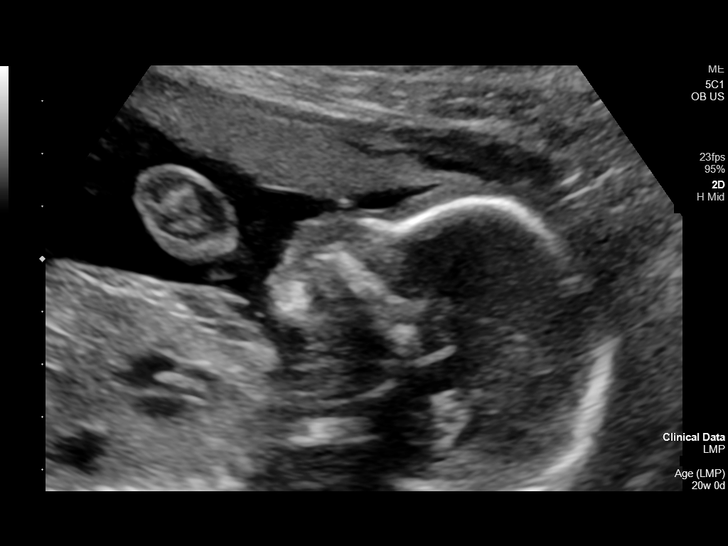
[im 49/83]
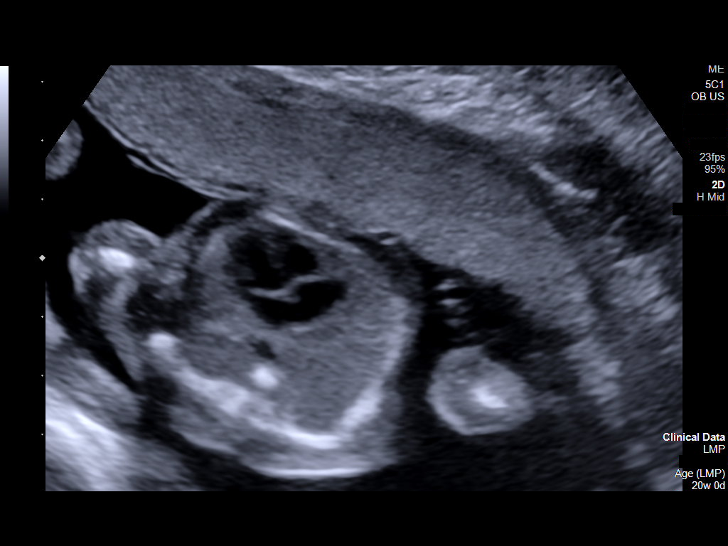
[im 55/83]
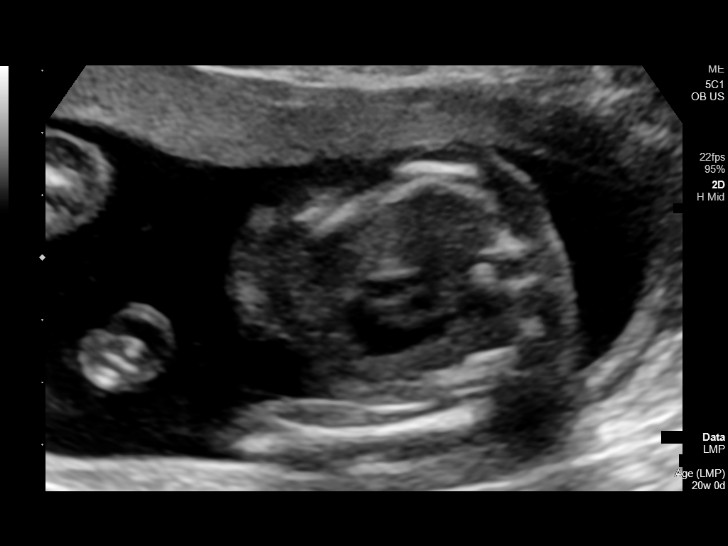
[im 61/83]
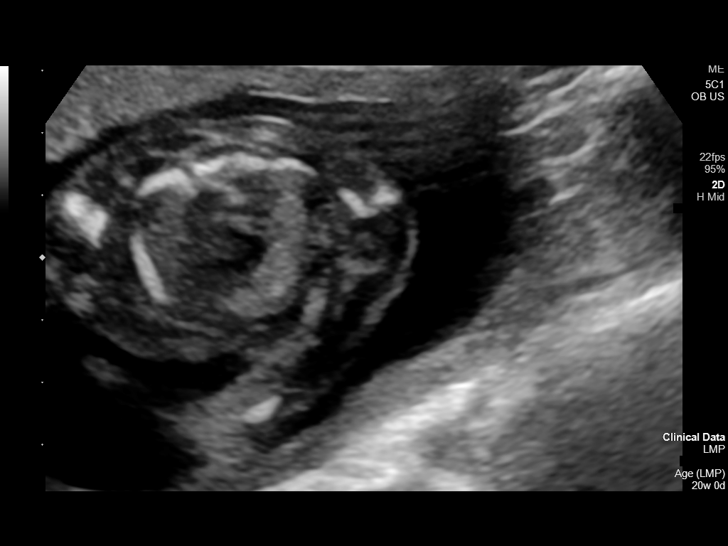
[im 67/83]
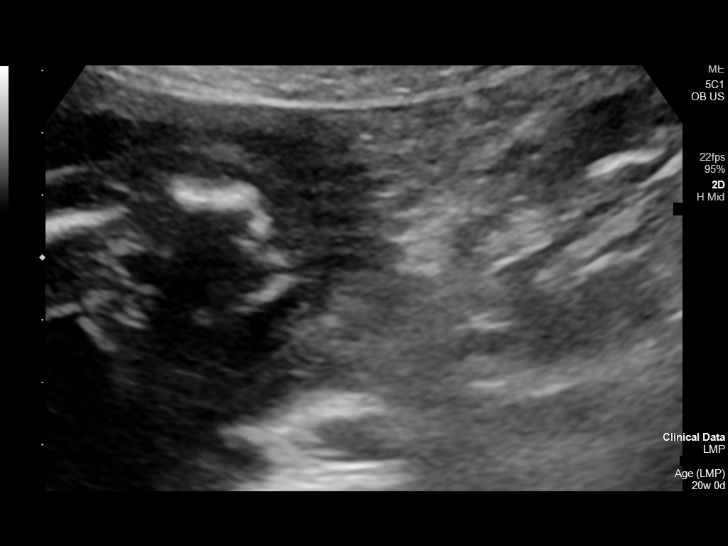
[im 73/83]
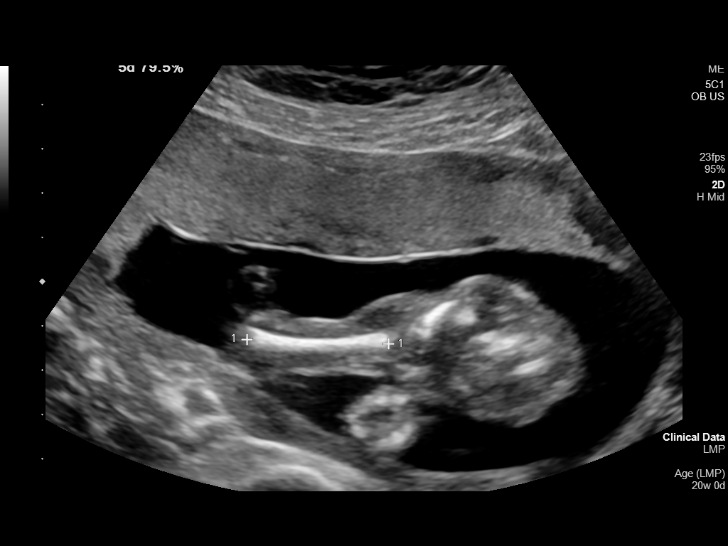
[im 79/83]
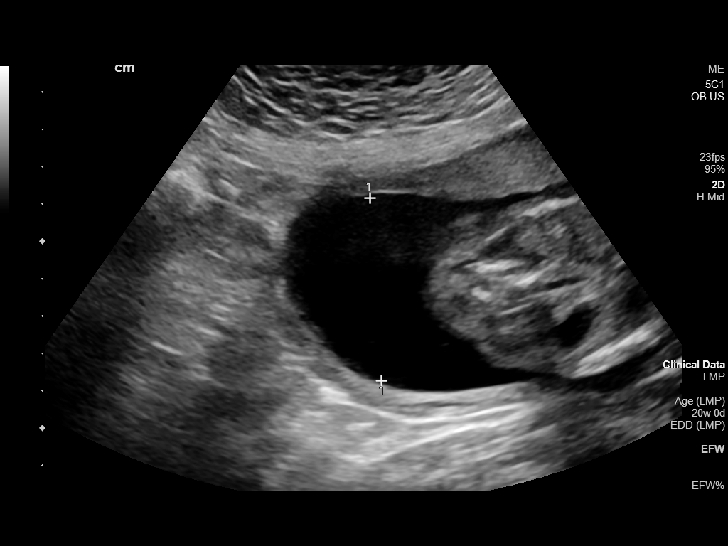

[13 of 28 positions shown; findings below may reference images not displayed]

FINDINGS: Number of Fetuses: 1

Heart Rate:  150 bpm

Movement: Yes

Presentation: Cephalic

Previa: No

Placental Location: Anterior

Amniotic Fluid (Subjective): Within normal limits

Amniotic Fluid (Objective):

Vertical pocket = 4.9cm

FETAL BIOMETRY

BPD: 4.9cm 20w 5d

HC:   18.0cm 20w 3d

AC:   15.9cm 21w 0d

FL:   3.3cm 20w 3d

Current Mean GA: 20w 3d US EDC: 05/06/2021

FETAL ANATOMY

Lateral Ventricles: Appears normal

Thalami/CSP: Appears normal

Posterior Fossa:  Appears normal

Nuchal Region: Appears normal   NFT= 4.8 mm

Upper Lip: Appears normal

Spine: Appears normal

4 Chamber Heart on Left: Appears normal

LVOT: Appears normal

RVOT: Appears normal

Stomach on Left: Appears normal

3 Vessel Cord: Appears normal

Cord Insertion site: Appears normal

Kidneys: Appears normal

Bladder: Appears normal

Extremities: Appears normal

Sex: Male

Maternal Findings:

Cervix:  6.2 cm TA
IMPRESSION: Single living IUP with estimated gestational age of 20 weeks 3 days,
and US EDC of 05/06/2021.

Unremarkable fetal anatomic survey.  No fetal anomalies identified.

## 2023-02-10 ENCOUNTER — Ambulatory Visit
Admission: EM | Admit: 2023-02-10 | Discharge: 2023-02-10 | Disposition: A | Discharge status: Home / Self Care | Payer: Medicaid Other | Attending: Family Medicine | Admitting: Family Medicine

## 2023-02-10 DIAGNOSIS — B9689 Other specified bacterial agents as the cause of diseases classified elsewhere: Secondary | ICD-10-CM | POA: Insufficient documentation

## 2023-02-10 DIAGNOSIS — N760 Acute vaginitis: Secondary | ICD-10-CM | POA: Diagnosis present

## 2023-02-10 DIAGNOSIS — B3731 Acute candidiasis of vulva and vagina: Secondary | ICD-10-CM | POA: Insufficient documentation

## 2023-02-10 LAB — WET PREP, GENITAL
Sperm: NONE SEEN
Trich, Wet Prep: NONE SEEN
WBC, Wet Prep HPF POC: <10 (ref <10)

## 2023-02-10 MED ORDER — METRONIDAZOLE 500 MG PO TABS: 500.0000 mg | ORAL_TABLET | Freq: Two times a day (BID) | ORAL | 0 refills | Status: DC

## 2023-02-10 MED ORDER — FLUCONAZOLE 150 MG PO TABS: 150.0000 mg | ORAL_TABLET | ORAL | 0 refills | Status: AC

## 2023-02-10 NOTE — Discharge Instructions (Signed)
You had evidence of of a bacterial and yeast infection today.  Stop by the pharmacy to pick up your prescriptions  For BV/bacterial vaginosis: Take metronidazole twice a day for the next 7 days.  Do not drink any alcohol with taking this medication.  For yeast infection: Take the first dose of Diflucan on day 3 and after you complete your antibiotics take the last dose.  If your symptoms do not improve in the next 7 days, be sure to follow-up here or at your primary care provider office.  Go to the emergency department if you are having increasing pain, worsening vaginal bleeding or fever.  

## 2023-02-10 NOTE — ED Triage Notes (Signed)
Sx x 3 days  Vaginal itching. Spotting a lot. Patient is on depo

## 2023-02-10 NOTE — ED Provider Notes (Signed)
[facility]    CSN: [id number] Arrival date & time: 02/10/23  1254      History   Chief Complaint Chief Complaint  Patient presents with   Vaginal Itching     HPI HPI Rachel Richards.    Rachel Richards presents for vaginal itching and irritation.  For the past 3 days.  Notes that she has some burning after she urinates.  No urinary frequency, urinary urgency or hematuria.  There has been no fevers, nausea or vomiting.  She has been having some vaginal spotting since getting her last Depo shot.  She does not have regular periods because of Depo.  She is not concerned with STDs.  She is breast-feeding.          Past Medical History:  Diagnosis Date   Chronic thoracic back pain    Depression    Marijuana use     Patient Active Problem List   Diagnosis Date Noted   Encounter for care or examination of lactating mother 04/29/2021   Supervision of normal pregnancy 10/13/2020   Vision changes 03/20/2020   Herpes simplex 11/26/2019   Postpartum care following vaginal delivery 08/20/2017    Past Surgical History:  Procedure Laterality Date   HAND SURGERY Right 2016   repair broken hand   WISDOM TOOTH EXTRACTION  2014   all four    OB History     Gravida  2   Para  2   Term  2   Preterm  0   AB  0   Living  2      SAB  0   IAB  0   Ectopic  0   Multiple  0   Live Births  2            Home Medications    Prior to Admission medications   Medication Sig Start Date End Date Taking? Authorizing Provider  fluconazole (DIFLUCAN) 150 MG tablet Take 1 tablet (150 mg total) by mouth every 3 (three) days for 2 doses. 02/10/23 02/14/23 Yes Rachel Libman, DO  medroxyPROGESTERone (DEPO-PROVERA) 150 MG/ML injection Inject into the muscle. 02/23/22 05/18/23 Yes [provider]  metroNIDAZOLE (FLAGYL) 500 MG tablet Take 1 tablet (500 mg total) by mouth 2 (two) times daily. 02/10/23  Yes Rachel Richards, Seward Meth, DO   Cholecalciferol (VITAMIN D-3) 25 MCG (1000 UT) CAPS     [provider]  etonogestrel (NEXPLANON) 68 MG IMPL implant 1 each by Subdermal route once. 06/27/21 06/27/24  Tresea Mall, CNM  Prenatal Vit-Fe Fumarate-FA (PRENATAL VITAMIN PO) Take 1 tablet by mouth daily.    [provider]  valACYclovir (VALTREX) 500 MG tablet Take 1 tablet (500 mg total) by mouth 2 (two) times daily. Patient not taking: Reported on 08/11/2021 04/13/21   Tresea Mall, CNM    Family History Family History  Problem Relation Age of Onset   Other Mother        MVA   Healthy Father    Colon cancer Maternal Uncle 44    Social History Social History   Tobacco Use   Smoking status: Never   Smokeless tobacco: Never  Vaping Use   Vaping status: Never Used  Substance Use Topics   Alcohol use: Yes    Comment: sometimes but not during pregnancy   Drug use: Not Currently    Types: Marijuana    Comment: +UDS for MJ early in pregnancy     Allergies  Patient has no known allergies.   Review of Systems Review of Systems: :negative unless otherwise stated in HPI.      Physical Exam Triage Vital Signs ED Triage Vitals  Encounter Vitals Group     BP 02/10/23 1502 129/76     Systolic BP Percentile --      Diastolic BP Percentile --      Pulse Rate 02/10/23 1502 74     Resp 02/10/23 1502 19     Temp 02/10/23 1502 98.7 F (37.1 C)     Temp Source 02/10/23 1502 Oral     SpO2 02/10/23 1502 98 %     Weight --      Height --      Head Circumference --      Peak Flow --      Pain Score 02/10/23 1501 0     Pain Loc --      Pain Education --      Exclude from Growth Chart --    No data found.  Updated Vital Signs BP 129/76 (BP Location: Right Arm)   Pulse 74   Temp 98.7 F (37.1 C) (Oral)   Resp 19   SpO2 98%   Visual Acuity Right Eye Distance:   Left Eye Distance:   Bilateral Distance:    Right Eye Near:   Left Eye Near:    Bilateral Near:     Physical Exam GEN:  well appearing Richards in no acute distress  CVS: well perfused  RESP: speaking in full sentences without pause  GU: deferred, patient performed self swab     UC Treatments / Results  Labs (all labs ordered are listed, but only abnormal results are displayed) Labs Reviewed  WET PREP, GENITAL - Abnormal; Notable for the following components:      Result Value   Yeast Wet Prep HPF POC PRESENT (*)    Clue Cells Wet Prep HPF POC PRESENT (*)    All other components within normal limits    EKG   Radiology No results found.  Procedures Procedures (including critical care time)  Medications Ordered in UC Medications - No data to display  Initial Impression / Assessment and Plan / UC Course  I have reviewed the triage vital signs and the nursing notes.  Pertinent labs & imaging results that were available during my care of the patient were reviewed by me and considered in my medical decision making (see chart for details).      Patient is a 27 y.o.Marland Kitchen Richards  who presents for vaginal itching and irritation.  Overall patient is well-appearing and afebrile.  Vital signs stable.  Wet prep showing evidence of yeast vaginitis and bacterial vaginitis but no trichomonas. Treatment:  Flagyl 500 BID x 7 days and advised patient to not drink alcohol while taking this medication. Diflucan for 2 doses for yeast infection    Return precautions including abdominal pain, fever, chills, nausea, or vomiting given. Discussed MDM, treatment plan and plan for follow-up with patient who agrees with plan.     Final Clinical Impressions(s) / UC Diagnoses   Final diagnoses:  Vaginal yeast infection  BV (bacterial vaginosis)     Discharge Instructions      You had evidence of of a bacterial and yeast infection today.  Stop by the pharmacy to pick up your prescriptions  For BV/bacterial vaginosis: Take metronidazole twice a day for the next 7 days.  Do not drink any alcohol with taking this  medication.  For yeast infection: Take the first dose of Diflucan on day 3 and after you complete your antibiotics take the last dose.  If your symptoms do not improve in the next 7 days, be sure to follow-up here or at your primary care provider office.  Go to the emergency department if you are having increasing pain, worsening vaginal bleeding or fever.      ED Prescriptions     Medication Sig Dispense Auth. Provider   fluconazole (DIFLUCAN) 150 MG tablet Take 1 tablet (150 mg total) by mouth every 3 (three) days for 2 doses. 2 tablet Rorie Delmore, DO   metroNIDAZOLE (FLAGYL) 500 MG tablet Take 1 tablet (500 mg total) by mouth 2 (two) times daily. 14 tablet Ayah Cozzolino, Seward Meth, DO      PDMP not reviewed this encounter.   Katha Cabal, DO 02/10/23 1559

## 2023-04-26 ENCOUNTER — Encounter: Payer: Self-pay | Admitting: Emergency Medicine

## 2023-04-26 ENCOUNTER — Ambulatory Visit
Admission: EM | Admit: 2023-04-26 | Discharge: 2023-04-26 | Disposition: A | Discharge status: Home / Self Care | Attending: Family Medicine | Admitting: Family Medicine

## 2023-04-26 DIAGNOSIS — N760 Acute vaginitis: Secondary | ICD-10-CM | POA: Insufficient documentation

## 2023-04-26 MED ORDER — FLUCONAZOLE 150 MG PO TABS: 150.0000 mg | ORAL_TABLET | Freq: Every day | ORAL | 0 refills | Status: AC

## 2023-04-26 NOTE — Discharge Instructions (Addendum)
 The clinical contact you with results of the vaginal swab done today if positive.  Start Diflucan as prescribed.  Follow-up with your PCP or GYN if your symptoms do not improve.  Please go to the ER for any worsening symptoms.  Hope you feel better soon!

## 2023-04-26 NOTE — ED Triage Notes (Signed)
 Patient states she was seen 10 days ago at her PCP's office and treated for BV. Patient states the test was inconclusive for BV  and yeast was absent. She states she completed the Flagyl but is still having itching and painful intercourse.

## 2023-04-26 NOTE — ED Provider Notes (Addendum)
 [facility]    CSN: [id number] Arrival date & time: 04/26/23  1355      History   Chief Complaint Chief Complaint  Patient presents with   Vaginal Itching    HPI Rachel Richards is a 28 y.o. female presents for vaginal itching.  Patient reports a few days of vaginal itching without discharge.  States she was treated by her PCP 10 days ago for BV with metronidazole.  Provider note shows that vaginal swab was inconclusive for BV and was negative for yeast.  Patient denies any dysuria, fevers, nausea/vomiting, flank pain, STD concern or exposure.  Has a history of yeast infections in the past.  Denies pregnancy and states she recently stopped breast-feeding.  No other concerns at this time.   Vaginal Itching    Past Medical History:  Diagnosis Date   Chronic thoracic back pain    Depression    Marijuana use     Patient Active Problem List   Diagnosis Date Noted   Encounter for care or examination of lactating mother 04/29/2021   Supervision of normal pregnancy 10/13/2020   Vision changes 03/20/2020   Herpes simplex 11/26/2019   Postpartum care following vaginal delivery 08/20/2017    Past Surgical History:  Procedure Laterality Date   HAND SURGERY Right 2016   repair broken hand   WISDOM TOOTH EXTRACTION  2014   all four    OB History     Gravida  2   Para  2   Term  2   Preterm  0   AB  0   Living  2      SAB  0   IAB  0   Ectopic  0   Multiple  0   Live Births  2            Home Medications    Prior to Admission medications   Medication Sig Start Date End Date Taking? Authorizing Provider  fluconazole (DIFLUCAN) 150 MG tablet Take 1 tablet (150 mg total) by mouth daily for 2 doses. Take 1 tablet today and may repeat in 3 days if symptoms persist 04/26/23 04/28/23 Yes Radford Pax, NP  Cholecalciferol (VITAMIN D-3) 25 MCG (1000 UT) CAPS     [provider]  medroxyPROGESTERone (DEPO-PROVERA) 150 MG/ML injection  Inject into the muscle. 02/23/22 05/18/23  [provider]  valACYclovir (VALTREX) 500 MG tablet Take 1 tablet (500 mg total) by mouth 2 (two) times daily. Patient not taking: Reported on 08/11/2021 04/13/21   Tresea Mall, CNM    Family History Family History  Problem Relation Age of Onset   Other Mother        MVA   Healthy Father    Colon cancer Maternal Uncle 64    Social History Social History   Tobacco Use   Smoking status: Never   Smokeless tobacco: Never  Vaping Use   Vaping status: Never Used  Substance Use Topics   Alcohol use: Yes    Comment: sometimes but not during pregnancy   Drug use: Not Currently    Types: Marijuana    Comment: +UDS for MJ early in pregnancy     Allergies   Patient has no known allergies.   Review of Systems Review of Systems  Genitourinary:        Vaginal itching     Physical Exam Triage Vital Signs ED Triage Vitals  Encounter Vitals Group     BP 04/26/23 1422 Marland Kitchen)  145/85     Systolic BP Percentile --      Diastolic BP Percentile --      Pulse Rate 04/26/23 1422 85     Resp 04/26/23 1422 18     Temp 04/26/23 1422 99.1 F (37.3 C)     Temp Source 04/26/23 1422 Oral     SpO2 04/26/23 1422 98 %     Weight --      Height --      Head Circumference --      Peak Flow --      Pain Score 04/26/23 1425 0     Pain Loc --      Pain Education --      Exclude from Growth Chart --    No data found.  Updated Vital Signs BP (!) 145/85 (BP Location: Right Arm)   Pulse 85   Temp 99.1 F (37.3 C) (Oral)   Resp 18   SpO2 98%   Breastfeeding Yes   Visual Acuity Right Eye Distance:   Left Eye Distance:   Bilateral Distance:    Right Eye Near:   Left Eye Near:    Bilateral Near:     Physical Exam Vitals and nursing note reviewed.  Constitutional:      Appearance: Normal appearance.  HENT:     Head: Normocephalic and atraumatic.  Eyes:     Pupils: Pupils are equal, round, and reactive to light.  Cardiovascular:      Rate and Rhythm: Normal rate.  Pulmonary:     Effort: Pulmonary effort is normal.  Skin:    General: Skin is warm and dry.  Neurological:     General: No focal deficit present.     Mental Status: She is alert and oriented to person, place, and time.  Psychiatric:        Mood and Affect: Mood normal.        Behavior: Behavior normal.      UC Treatments / Results  Labs (all labs ordered are listed, but only abnormal results are displayed) Labs Reviewed  CERVICOVAGINAL ANCILLARY ONLY    EKG   Radiology No results found.  Procedures Procedures (including critical care time)  Medications Ordered in UC Medications - No data to display  Initial Impression / Assessment and Plan / UC Course  I have reviewed the triage vital signs and the nursing notes.  Pertinent labs & imaging results that were available during my care of the patient were reviewed by me and considered in my medical decision making (see chart for details).     Reviewed exam and symptoms with patient.  No red flags.  Vaginal swab was ordered and will contact for any positive results.  Given recent antibiotic usage we will treat for yeast with Diflucan.  Advised PCP or GYN follow-up if symptoms do not improve.  ER precautions reviewed. Final Clinical Impressions(s) / UC Diagnoses   Final diagnoses:  Acute vaginitis     Discharge Instructions      The clinical contact you with results of the vaginal swab done today if positive.  Start Diflucan as prescribed.  Follow-up with your PCP or GYN if your symptoms do not improve.  Please go to the ER for any worsening symptoms.  Hope you feel better soon!    ED Prescriptions     Medication Sig Dispense Auth. Provider   fluconazole (DIFLUCAN) 150 MG tablet Take 1 tablet (150 mg total) by mouth daily for 2 doses. Take  1 tablet today and may repeat in 3 days if symptoms persist 2 tablet Radford Pax, NP      PDMP not reviewed this encounter.   Radford Pax, NP 04/26/23 1441    Radford Pax, NP 04/26/23 [phone]

## 2023-04-27 ENCOUNTER — Telehealth (HOSPITAL_COMMUNITY): Payer: Self-pay

## 2023-04-27 LAB — CERVICOVAGINAL ANCILLARY ONLY
Bacterial Vaginitis (gardnerella): POSITIVE — AB
Candida Glabrata: NEGATIVE
Candida Vaginitis: POSITIVE — AB
Comment: NEGATIVE
Comment: NEGATIVE
Comment: NEGATIVE

## 2023-04-27 MED ORDER — METRONIDAZOLE 0.75 % VA GEL: 1.0000 | Freq: Every day | VAGINAL | 0 refills | Status: AC

## 2023-04-27 NOTE — Telephone Encounter (Signed)
 Per protocol, pt requires tx with metronidazole. Reviewed with patient, verified pharmacy, prescription sent.
